# Patient Record
Sex: Female | Born: 1970 | Race: White | Hispanic: No | Marital: Married | Smoking: Never smoker
Health system: Southern US, Community
[De-identification: ages and names within clinical notes are randomized; demographics above are authoritative.]

## PROBLEM LIST (undated history)

## (undated) DIAGNOSIS — I10 Essential (primary) hypertension: Secondary | ICD-10-CM

## (undated) HISTORY — DX: Essential (primary) hypertension: I10

---

## 2002-02-27 ENCOUNTER — Emergency Department (HOSPITAL_COMMUNITY): Admission: EM | Admit: 2002-02-27 | Discharge: 2002-02-27 | Payer: Self-pay | Admitting: Emergency Medicine

## 2005-05-01 ENCOUNTER — Emergency Department (HOSPITAL_COMMUNITY): Admission: EM | Admit: 2005-05-01 | Discharge: 2005-05-02 | Payer: Self-pay | Admitting: *Deleted

## 2013-01-12 ENCOUNTER — Encounter: Payer: Self-pay | Admitting: Nurse Practitioner

## 2013-01-12 ENCOUNTER — Ambulatory Visit: Payer: Self-pay | Admitting: Nurse Practitioner

## 2013-01-12 VITALS — BP 126/81 | HR 80 | Temp 98.0°F | Ht 61.25 in | Wt 165.5 lb

## 2013-01-12 DIAGNOSIS — I10 Essential (primary) hypertension: Secondary | ICD-10-CM

## 2013-01-12 LAB — COMPLETE METABOLIC PANEL WITH GFR
ALT: 24 U/L (ref 0–35)
AST: 47 U/L — ABNORMAL HIGH (ref 0–37)
Albumin: 4 g/dL (ref 3.5–5.2)
Alkaline Phosphatase: 93 U/L (ref 39–117)
BUN: 11 mg/dL (ref 6–23)
Calcium: 9.5 mg/dL (ref 8.4–10.5)
Chloride: 105 mEq/L (ref 96–112)
Potassium: 4.5 mEq/L (ref 3.5–5.3)

## 2013-01-12 MED ORDER — LISINOPRIL 20 MG PO TABS: 20.0000 mg | ORAL_TABLET | Freq: Every day | ORAL | Status: DC

## 2013-01-12 NOTE — Patient Instructions (Signed)

## 2013-01-12 NOTE — Progress Notes (Signed)
  Subjective:    Patient ID: Grace Rodriguez, female    DOB: 04/10/1971, 42 y.o.   MRN: [id number]  Hypertension This is a chronic problem. The current episode started more than 1 year ago. The problem is unchanged. The problem is controlled. Associated symptoms include peripheral edema. Pertinent negatives include no blurred vision, chest pain, headaches, neck pain, palpitations or shortness of breath. There are no associated agents to hypertension. There are no known risk factors for coronary artery disease. Past treatments include ACE inhibitors. The current treatment provides significant improvement. There are no compliance problems.       Review of Systems  HENT: Negative for neck pain.   Eyes: Negative for blurred vision.  Respiratory: Negative for shortness of breath.   Cardiovascular: Negative for chest pain and palpitations.  Neurological: Negative for headaches.  All other systems reviewed and are negative.       Objective:   Physical Exam  Constitutional: She is oriented to person, place, and time. She appears well-developed and well-nourished.  HENT:  Nose: Nose normal.  Mouth/Throat: Oropharynx is clear and moist.  Eyes: EOM are normal.  Neck: Trachea normal, normal range of motion and full passive range of motion without pain. Neck supple. No JVD present. Carotid bruit is not present. No thyromegaly present.  Cardiovascular: Normal rate, regular rhythm, normal heart sounds and intact distal pulses.  Exam reveals no gallop and no friction rub.   No murmur heard. Pulmonary/Chest: Effort normal and breath sounds normal.  Abdominal: Soft. Bowel sounds are normal. She exhibits no distension and no mass. There is no tenderness.  Musculoskeletal: Normal range of motion.  Lymphadenopathy:    She has no cervical adenopathy.  Neurological: She is alert and oriented to person, place, and time. She has normal reflexes.  Skin: Skin is warm and dry.  Psychiatric: She has a normal mood  and affect. Her behavior is normal. Judgment and thought content normal.    BP 126/81  Pulse 80  Temp(Src) 98 F (36.7 C) (Oral)  Ht 5' 1.25" (1.556 m)  Wt 165 lb 8 oz (75.07 kg)  BMI 31.01 kg/m2  LMP 12/18/2012       Assessment & Plan:  1. Hypertension Low NA+ diet LOw fat diet and exercise - COMPLETE METABOLIC PANEL WITH GFR - NMR Lipoprofile with Lipids - lisinopril (PRINIVIL,ZESTRIL) 20 MG tablet; Take 1 tablet (20 mg total) by mouth daily.  Dispense: 30 tablet; Refill: 5  Mary-Margaret Daphine Deutscher, FNP

## 2013-01-14 LAB — NMR LIPOPROFILE WITH LIPIDS
Cholesterol, Total: 168 mg/dL (ref <200)
HDL Particle Number: 40 umol/L (ref >=30.5)
LDL (calc): 96 mg/dL (ref <100)
LDL Particle Number: 1648 nmol/L — ABNORMAL HIGH (ref <1000)
LP-IR Score: 50 — ABNORMAL HIGH (ref <=45)
Triglycerides: 106 mg/dL (ref <150)
VLDL Size: 52.6 nm — ABNORMAL HIGH (ref <=46.6)

## 2013-07-11 ENCOUNTER — Encounter: Payer: Self-pay | Admitting: General Practice

## 2013-07-11 ENCOUNTER — Ambulatory Visit (INDEPENDENT_AMBULATORY_CARE_PROVIDER_SITE_OTHER): Payer: BC Managed Care – PPO | Admitting: General Practice

## 2013-07-11 VITALS — BP 119/80 | HR 88 | Temp 98.9°F | Ht 61.25 in | Wt 159.0 lb

## 2013-07-11 DIAGNOSIS — I10 Essential (primary) hypertension: Secondary | ICD-10-CM

## 2013-07-11 DIAGNOSIS — E785 Hyperlipidemia, unspecified: Secondary | ICD-10-CM

## 2013-07-11 MED ORDER — ROSUVASTATIN CALCIUM 20 MG PO TABS: 20.0000 mg | ORAL_TABLET | Freq: Every day | ORAL | Status: DC

## 2013-07-11 MED ORDER — LISINOPRIL 20 MG PO TABS: 20.0000 mg | ORAL_TABLET | Freq: Every day | ORAL | Status: DC

## 2013-07-11 NOTE — Patient Instructions (Signed)
Hypertriglyceridemia  Diet for High blood levels of Triglycerides Most fats in food are triglycerides. Triglycerides in your blood are stored as fat in your body. High levels of triglycerides in your blood may put you at a greater risk for heart disease and stroke.  Normal triglyceride levels are less than 150 mg/dL. Borderline high levels are 150-199 mg/dl. High levels are 200 - 499 mg/dL, and very high triglyceride levels are greater than 500 mg/dL. The decision to treat high triglycerides is generally based on the level. For people with borderline or high triglyceride levels, treatment includes weight loss and exercise. Drugs are recommended for people with very high triglyceride levels. Many people who need treatment for high triglyceride levels have metabolic syndrome. This syndrome is a collection of disorders that often include: insulin resistance, high blood pressure, blood clotting problems, high cholesterol and triglycerides. TESTING PROCEDURE FOR TRIGLYCERIDES  You should not eat 4 hours before getting your triglycerides measured. The normal range of triglycerides is between 10 and 250 milligrams per deciliter (mg/dl). Some people may have extreme levels (1000 or above), but your triglyceride level may be too high if it is above 150 mg/dl, depending on what other risk factors you have for heart disease.  People with high blood triglycerides may also have high blood cholesterol levels. If you have high blood cholesterol as well as high blood triglycerides, your risk for heart disease is probably greater than if you only had high triglycerides. High blood cholesterol is one of the main risk factors for heart disease. CHANGING YOUR DIET  Your weight can affect your blood triglyceride level. If you are more than 20% above your ideal body weight, you may be able to lower your blood triglycerides by losing weight. Eating less and exercising regularly is the best way to combat this. Fat provides more  calories than any other food. The best way to lose weight is to eat less fat. Only 30% of your total calories should come from fat. Less than 7% of your diet should come from saturated fat. A diet low in fat and saturated fat is the same as a diet to decrease blood cholesterol. By eating a diet lower in fat, you may lose weight, lower your blood cholesterol, and lower your blood triglyceride level.  Eating a diet low in fat, especially saturated fat, may also help you lower your blood triglyceride level. Ask your dietitian to help you figure how much fat you can eat based on the number of calories your caregiver has prescribed for you.  Exercise, in addition to helping with weight loss may also help lower triglyceride levels.   Alcohol can increase blood triglycerides. You may need to stop drinking alcoholic beverages.  Too much carbohydrate in your diet may also increase your blood triglycerides. Some complex carbohydrates are necessary in your diet. These may include bread, rice, potatoes, other starchy vegetables and cereals.  Reduce "simple" carbohydrates. These may include pure sugars, candy, honey, and jelly without losing other nutrients. If you have the kind of high blood triglycerides that is affected by the amount of carbohydrates in your diet, you will need to eat less sugar and less high-sugar foods. Your caregiver can help you with this.  Adding 2-4 grams of fish oil (EPA+ DHA) may also help lower triglycerides. Speak with your caregiver before adding any supplements to your regimen. Following the Diet  Maintain your ideal weight. Your caregivers can help you with a diet. Generally, eating less food and getting more   exercise will help you lose weight. Joining a weight control group may also help. Ask your caregivers for a good weight control group in your area.  Eat low-fat foods instead of high-fat foods. This can help you lose weight too.  These foods are lower in fat. Eat MORE of these:    Dried beans, peas, and lentils.  Egg whites.  Low-fat cottage cheese.  Fish.  Lean cuts of meat, such as round, sirloin, rump, and flank (cut extra fat off meat you fix).  Whole grain breads, cereals and pasta.  Skim and nonfat dry milk.  Low-fat yogurt.  Poultry without the skin.  Cheese made with skim or part-skim milk, such as mozzarella, parmesan, farmers', ricotta, or pot cheese. These are higher fat foods. Eat LESS of these:   Whole milk and foods made from whole milk, such as American, blue, cheddar, monterey jack, and swiss cheese  High-fat meats, such as luncheon meats, sausages, knockwurst, bratwurst, hot dogs, ribs, corned beef, ground pork, and regular ground beef.  Fried foods. Limit saturated fats in your diet. Substituting unsaturated fat for saturated fat may decrease your blood triglyceride level. You will need to read package labels to know which products contain saturated fats.  These foods are high in saturated fat. Eat LESS of these:   Fried pork skins.  Whole milk.  Skin and fat from poultry.  Palm oil.  Butter.  Shortening.  Cream cheese.  Bacon.  Margarines and baked goods made from listed oils.  Vegetable shortenings.  Chitterlings.  Fat from meats.  Coconut oil.  Palm kernel oil.  Lard.  Cream.  Sour cream.  Fatback.  Coffee whiteners and non-dairy creamers made with these oils.  Cheese made from whole milk. Use unsaturated fats (both polyunsaturated and monounsaturated) moderately. Remember, even though unsaturated fats are better than saturated fats; you still want a diet low in total fat.  These foods are high in unsaturated fat:   Canola oil.  Sunflower oil.  Mayonnaise.  Almonds.  Peanuts.  Pine nuts.  Margarines made with these oils.  Safflower oil.  Olive oil.  Avocados.  Cashews.  Peanut butter.  Sunflower seeds.  Soybean oil.  Peanut  oil.  Olives.  Pecans.  Walnuts.  Pumpkin seeds. Avoid sugar and other high-sugar foods. This will decrease carbohydrates without decreasing other nutrients. Sugar in your food goes rapidly to your blood. When there is excess sugar in your blood, your liver may use it to make more triglycerides. Sugar also contains calories without other important nutrients.  Eat LESS of these:   Sugar, brown sugar, powdered sugar, jam, jelly, preserves, honey, syrup, molasses, pies, candy, cakes, cookies, frosting, pastries, colas, soft drinks, punches, fruit drinks, and regular gelatin.  Avoid alcohol. Alcohol, even more than sugar, may increase blood triglycerides. In addition, alcohol is high in calories and low in nutrients. Ask for sparkling water, or a diet soft drink instead of an alcoholic beverage. Suggestions for planning and preparing meals   Bake, broil, grill or roast meats instead of frying.  Remove fat from meats and skin from poultry before cooking.  Add spices, herbs, lemon juice or vinegar to vegetables instead of salt, rich sauces or gravies.  Use a non-stick skillet without fat or use no-stick sprays.  Cool and refrigerate stews and broth. Then remove the hardened fat floating on the surface before serving.  Refrigerate meat drippings and skim off fat to make low-fat gravies.  Serve more fish.  Use less butter,   margarine and other high-fat spreads on bread or vegetables.  Use skim or reconstituted non-fat dry milk for cooking.  Cook with low-fat cheeses.  Substitute low-fat yogurt or cottage cheese for all or part of the sour cream in recipes for sauces, dips or congealed salads.  Use half yogurt/half mayonnaise in salad recipes.  Substitute evaporated skim milk for cream. Evaporated skim milk or reconstituted non-fat dry milk can be whipped and substituted for whipped cream in certain recipes.  Choose fresh fruits for dessert instead of high-fat foods such as pies or  cakes. Fruits are naturally low in fat. When Dining Out   Order low-fat appetizers such as fruit or vegetable juice, pasta with vegetables or tomato sauce.  Select clear, rather than cream soups.  Ask that dressings and gravies be served on the side. Then use less of them.  Order foods that are baked, broiled, poached, steamed, stir-fried, or roasted.  Ask for margarine instead of butter, and use only a small amount.  Drink sparkling water, unsweetened tea or coffee, or diet soft drinks instead of alcohol or other sweet beverages. QUESTIONS AND ANSWERS ABOUT OTHER FATS IN THE BLOOD: SATURATED FAT, TRANS FAT, AND CHOLESTEROL What is trans fat? Trans fat is a type of fat that is formed when vegetable oil is hardened through a process called hydrogenation. This process helps makes foods more solid, gives them shape, and prolongs their shelf life. Trans fats are also called hydrogenated or partially hydrogenated oils.  What do saturated fat, trans fat, and cholesterol in foods have to do with heart disease? Saturated fat, trans fat, and cholesterol in the diet all raise the level of LDL "bad" cholesterol in the blood. The higher the LDL cholesterol, the greater the risk for coronary heart disease (CHD). Saturated fat and trans fat raise LDL similarly.  What foods contain saturated fat, trans fat, and cholesterol? High amounts of saturated fat are found in animal products, such as fatty cuts of meat, chicken skin, and full-fat dairy products like butter, whole milk, cream, and cheese, and in tropical vegetable oils such as palm, palm kernel, and coconut oil. Trans fat is found in some of the same foods as saturated fat, such as vegetable shortening, some margarines (especially hard or stick margarine), crackers, cookies, baked goods, fried foods, salad dressings, and other processed foods made with partially hydrogenated vegetable oils. Small amounts of trans fat also occur naturally in some animal  products, such as milk products, beef, and lamb. Foods high in cholesterol include liver, other organ meats, egg yolks, shrimp, and full-fat dairy products. How can I use the new food label to make heart-healthy food choices? Check the Nutrition Facts panel of the food label. Choose foods lower in saturated fat, trans fat, and cholesterol. For saturated fat and cholesterol, you can also use the Percent Daily Value (%DV): 5% DV or less is low, and 20% DV or more is high. (There is no %DV for trans fat.) Use the Nutrition Facts panel to choose foods low in saturated fat and cholesterol, and if the trans fat is not listed, read the ingredients and limit products that list shortening or hydrogenated or partially hydrogenated vegetable oil, which tend to be high in trans fat. POINTS TO REMEMBER:   Discuss your risk for heart disease with your caregivers, and take steps to reduce risk factors.  Change your diet. Choose foods that are low in saturated fat, trans fat, and cholesterol.  Add exercise to your daily routine if   it is not already being done. Participate in physical activity of moderate intensity, like brisk walking, for at least 30 minutes on most, and preferably all days of the week. No time? Break the 30 minutes into three, 10-minute segments during the day.  Stop smoking. If you do smoke, contact your caregiver to discuss ways in which they can help you quit.  Do not use street drugs.  Maintain a normal weight.  Maintain a healthy blood pressure.  Keep up with your blood work for checking the fats in your blood as directed by your caregiver. Document Released: 06/05/2004 Document Revised: 02/17/2012 Document Reviewed: 01/01/2009 [employer] Patient Information 2014 [employer].  

## 2013-07-11 NOTE — Progress Notes (Signed)
  Subjective:    Patient ID: Grace Rodriguez, female    DOB: May 01, 1971, 42 y.o.   MRN: [id number]  HPI Patient presents today for chronic health follow up. She has a history of hypertension and hyperlipidemia. She denies taking any medication for hyperlipidemia. She is willing to begin prescription medication for hyperlipidemia.  Denies regular exercise or healthy eating.    Review of Systems  Constitutional: Negative for fever and chills.  Respiratory: Negative for chest tightness and shortness of breath.   Cardiovascular: Negative for chest pain and palpitations.  Gastrointestinal: Negative for nausea, vomiting, abdominal pain, diarrhea, constipation and blood in stool.  Genitourinary: Negative for dysuria and difficulty urinating.  Neurological: Negative for dizziness, weakness and headaches.       Objective:   Physical Exam  Constitutional: She is oriented to person, place, and time. She appears well-developed and well-nourished.  HENT:  Head: Normocephalic and atraumatic.  Right Ear: External ear normal.  Left Ear: External ear normal.  Nose: Nose normal.  Mouth/Throat: Oropharynx is clear and moist.  Eyes: EOM are normal. Pupils are equal, round, and reactive to light.  Neck: Normal range of motion. Neck supple. No thyromegaly present.  Cardiovascular: Normal rate, regular rhythm and normal heart sounds.   Pulmonary/Chest: Effort normal and breath sounds normal. No respiratory distress. She exhibits no tenderness.  Abdominal: Soft. Bowel sounds are normal. She exhibits no distension. There is no tenderness.  Musculoskeletal: She exhibits no edema and no tenderness.  Lymphadenopathy:    She has no cervical adenopathy.  Neurological: She is alert and oriented to person, place, and time.  Skin: Skin is warm and dry.  Psychiatric: She has a normal mood and affect.          Assessment & Plan:  1. Hypertension - lisinopril (PRINIVIL,ZESTRIL) 20 MG tablet; Take 1 tablet (20 mg  total) by mouth daily.  Dispense: 30 tablet; Refill: 5 - CMP14+EGFR  2. Hyperlipidemia - NMR, lipoprofile - rosuvastatin (CRESTOR) 20 MG tablet; Take 1 tablet (20 mg total) by mouth daily.  Dispense: 30 tablet; Refill: 3 (patient may pick up prescription after completing samples, if able to tolerate, otherwise contact office) -crestor 10mg  tablets, lot # [id number], exp date 10-2015, samples given 35 tablets  -discussed regular exercise and healthy eating -RTO in 6 months for routine follow up -Patient verbalized understanding -Coralie Keens, FNP-C

## 2013-07-12 ENCOUNTER — Other Ambulatory Visit (INDEPENDENT_AMBULATORY_CARE_PROVIDER_SITE_OTHER): Payer: BC Managed Care – PPO

## 2013-07-12 DIAGNOSIS — R7989 Other specified abnormal findings of blood chemistry: Secondary | ICD-10-CM

## 2013-07-12 NOTE — Progress Notes (Signed)
K+ come in 07/12/13 as 5.9 still waiting on NMR for labs to result in epic  Will call patient to come in for a repeat k+ due to critical result from 07/11/13

## 2013-07-13 ENCOUNTER — Other Ambulatory Visit: Payer: Self-pay | Admitting: General Practice

## 2013-07-13 ENCOUNTER — Telehealth: Payer: Self-pay | Admitting: General Practice

## 2013-07-13 LAB — NMR, LIPOPROFILE
Cholesterol: 158 mg/dL (ref <200)
HDL Cholesterol by NMR: 50 mg/dL (ref >=40)
HDL Particle Number: 43.1 umol/L (ref >=30.5)
LDLC SERPL CALC-MCNC: 85 mg/dL (ref <100)
Triglycerides by NMR: 117 mg/dL (ref <150)

## 2013-07-13 LAB — CMP14+EGFR
Albumin/Globulin Ratio: 1.5 (ref 1.1–2.5)
Albumin: 4.4 g/dL (ref 3.5–5.5)
BUN: 10 mg/dL (ref 6–24)
CO2: 24 mmol/L (ref 18–29)
Calcium: 9.8 mg/dL (ref 8.7–10.2)
Creatinine, Ser: 0.6 mg/dL (ref 0.57–1.00)
GFR calc non Af Amer: 111 mL/min/{1.73_m2} (ref >59)
Globulin, Total: 3 g/dL (ref 1.5–4.5)
Total Protein: 7.4 g/dL (ref 6.0–8.5)

## 2013-07-13 LAB — POTASSIUM: Potassium: 4.7 mmol/L (ref 3.5–5.2)

## 2013-11-03 ENCOUNTER — Encounter: Payer: Self-pay | Admitting: Nurse Practitioner

## 2013-11-03 ENCOUNTER — Ambulatory Visit (INDEPENDENT_AMBULATORY_CARE_PROVIDER_SITE_OTHER): Payer: BC Managed Care – PPO | Admitting: Nurse Practitioner

## 2013-11-03 VITALS — BP 120/79 | HR 90 | Temp 98.6°F | Ht 61.0 in | Wt 155.0 lb

## 2013-11-03 DIAGNOSIS — J069 Acute upper respiratory infection, unspecified: Secondary | ICD-10-CM

## 2013-11-03 DIAGNOSIS — J029 Acute pharyngitis, unspecified: Secondary | ICD-10-CM

## 2013-11-03 LAB — POCT RAPID STREP A (OFFICE): RAPID STREP A SCREEN: NEGATIVE

## 2013-11-03 MED ORDER — AZITHROMYCIN 250 MG PO TABS: ORAL_TABLET | ORAL | Status: DC

## 2013-11-03 NOTE — Patient Instructions (Signed)

## 2013-11-03 NOTE — Progress Notes (Signed)
   Subjective:    Patient ID: Grace Rodriguez, female    DOB: 23-Sep-1970, 43 y.o.   MRN: [id number]  HPI  Patient in c/o cough and congestion that started several days ago- sore throat started yesterday. Low grade fever in the evening.    Review of Systems  Constitutional: Positive for fever and chills.  HENT: Positive for congestion, ear pain, rhinorrhea, sore throat, trouble swallowing and voice change.   Respiratory: Choking: nonproductive.   Cardiovascular: Negative.   All other systems reviewed and are negative.       Objective:   Physical Exam  Constitutional: She is oriented to person, place, and time. She appears well-developed and well-nourished.  HENT:  Right Ear: Hearing, tympanic membrane, external ear and ear canal normal.  Left Ear: Hearing, tympanic membrane, external ear and ear canal normal.  Nose: Mucosal edema and rhinorrhea present. Right sinus exhibits no maxillary sinus tenderness and no frontal sinus tenderness. Left sinus exhibits no maxillary sinus tenderness and no frontal sinus tenderness.  Mouth/Throat: Uvula is midline. Posterior oropharyngeal edema and posterior oropharyngeal erythema (mild) present.  Eyes: Pupils are equal, round, and reactive to light.  Neck: Normal range of motion. Neck supple. No thyromegaly present.  Cardiovascular: Normal rate, regular rhythm and normal heart sounds.   Pulmonary/Chest: Effort normal and breath sounds normal.  Abdominal: Soft. Bowel sounds are normal.  Lymphadenopathy:    She has no cervical adenopathy.  Neurological: She is alert and oriented to person, place, and time.  Skin: Skin is warm and dry.  Psychiatric: She has a normal mood and affect. Her behavior is normal. Judgment and thought content normal.   BP 120/79  Pulse 90  Temp(Src) 98.6 F (37 C) (Oral)  Ht 5\' 1"  (1.549 m)  Wt 155 lb (70.308 kg)  BMI 29.30 kg/m2        Assessment & Plan:   1. Acute pharyngitis   2. Upper respiratory infection  with cough and congestion    No orders of the defined types were placed in this encounter.   1. Take meds as prescribed 2. Use a cool mist humidifier especially during the winter months and when heat has been humid. 3. Use saline nose sprays frequently 4. Saline irrigations of the nose can be very helpful if done frequently.  * 4X daily for 1 week*  * Use of a nettie pot can be helpful with this. Follow directions with this* 5. Drink plenty of fluids 6. Keep thermostat turn down low 7.For any cough or congestion  Use plain Mucinex- regular strength or max strength is fine   * Children- consult with Pharmacist for dosing 8. For fever or aces or pains- take tylenol or ibuprofen appropriate for age and weight.  * for fevers greater than 101 orally you may alternate ibuprofen and tylenol every  3 hours.   Mary-Margaret Daphine DeutscherMartin, FNP

## 2013-12-12 ENCOUNTER — Other Ambulatory Visit: Payer: Self-pay | Admitting: General Practice

## 2014-01-09 ENCOUNTER — Encounter: Payer: Self-pay | Admitting: Nurse Practitioner

## 2014-01-09 ENCOUNTER — Ambulatory Visit (INDEPENDENT_AMBULATORY_CARE_PROVIDER_SITE_OTHER): Payer: BC Managed Care – PPO | Admitting: Nurse Practitioner

## 2014-01-09 VITALS — BP 122/78 | HR 86 | Temp 98.2°F | Ht 60.0 in | Wt 158.0 lb

## 2014-01-09 DIAGNOSIS — Z01419 Encounter for gynecological examination (general) (routine) without abnormal findings: Secondary | ICD-10-CM

## 2014-01-09 DIAGNOSIS — Z0000 Encounter for general adult medical examination without abnormal findings: Secondary | ICD-10-CM

## 2014-01-09 DIAGNOSIS — E785 Hyperlipidemia, unspecified: Secondary | ICD-10-CM | POA: Insufficient documentation

## 2014-01-09 DIAGNOSIS — I10 Essential (primary) hypertension: Secondary | ICD-10-CM

## 2014-01-09 DIAGNOSIS — Z124 Encounter for screening for malignant neoplasm of cervix: Secondary | ICD-10-CM

## 2014-01-09 LAB — POCT URINALYSIS DIPSTICK
BILIRUBIN UA: NEGATIVE
Glucose, UA: NEGATIVE
KETONES UA: NEGATIVE
LEUKOCYTES UA: NEGATIVE
Nitrite, UA: NEGATIVE
PH UA: 5
Protein, UA: NEGATIVE
SPEC GRAV UA: 1.01
Urobilinogen, UA: NEGATIVE

## 2014-01-09 LAB — POCT CBC
GRANULOCYTE PERCENT: 60.6 % (ref 37–80)
HEMATOCRIT: 38.8 % (ref 37.7–47.9)
HEMOGLOBIN: 12.5 g/dL (ref 12.2–16.2)
Lymph, poc: 3.4 (ref 0.6–3.4)
MCH, POC: 28.9 pg (ref 27–31.2)
MCHC: 32.2 g/dL (ref 31.8–35.4)
MCV: 89.8 fL (ref 80–97)
MPV: 7.8 fL (ref 0–99.8)
POC Granulocyte: 6 (ref 2–6.9)
POC LYMPH %: 34.4 % (ref 10–50)
Platelet Count, POC: 414 10*3/uL (ref 142–424)
RBC: 4.3 M/uL (ref 4.04–5.48)
RDW, POC: 14.4 %
WBC: 9.9 10*3/uL (ref 4.6–10.2)

## 2014-01-09 MED ORDER — ROSUVASTATIN CALCIUM 20 MG PO TABS: 20.0000 mg | ORAL_TABLET | Freq: Every day | ORAL | Status: DC

## 2014-01-09 MED ORDER — LISINOPRIL 20 MG PO TABS: 20.0000 mg | ORAL_TABLET | Freq: Every day | ORAL | Status: DC

## 2014-01-09 NOTE — Progress Notes (Signed)
Subjective:    Patient ID: Grace Rodriguez, female    DOB: 12/16/1970, 43 y.o.   MRN: [id number]  Patient here today for CPE and PAP- She is doing well today without complaints  Hypertension This is a chronic problem. The current episode started more than 1 year ago. The problem is unchanged. The problem is controlled. Associated symptoms include peripheral edema. Pertinent negatives include no blurred vision, chest pain, headaches, neck pain, palpitations or shortness of breath. There are no associated agents to hypertension. There are no known risk factors for coronary artery disease. Past treatments include ACE inhibitors. The current treatment provides significant improvement. There are no compliance problems.   Hyperlipidemia This is a chronic problem. The current episode started more than 1 year ago. The problem is uncontrolled. Recent lipid tests were reviewed and are high. Pertinent negatives include no chest pain or shortness of breath. Compliance problems include adherence to diet and adherence to exercise.  Risk factors for coronary artery disease include hypertension.      Review of Systems  Eyes: Negative for blurred vision.  Respiratory: Negative for shortness of breath.   Cardiovascular: Negative for chest pain and palpitations.  Musculoskeletal: Negative for neck pain.  Neurological: Negative for headaches.  All other systems reviewed and are negative.      Objective:   Physical Exam  Constitutional: She is oriented to person, place, and time. She appears well-developed and well-nourished.  HENT:  Head: Normocephalic.  Right Ear: Hearing, tympanic membrane, external ear and ear canal normal.  Left Ear: Hearing, tympanic membrane, external ear and ear canal normal.  Nose: Nose normal.  Mouth/Throat: Uvula is midline and oropharynx is clear and moist.  Eyes: Conjunctivae and EOM are normal. Pupils are equal, round, and reactive to light.  Neck: Trachea normal, normal range  of motion and full passive range of motion without pain. Neck supple. No JVD present. Carotid bruit is not present. No mass and no thyromegaly present.  Cardiovascular: Normal rate, regular rhythm, normal heart sounds and intact distal pulses.  Exam reveals no gallop and no friction rub.   No murmur heard. Pulmonary/Chest: Effort normal and breath sounds normal. Right breast exhibits no inverted nipple, no mass, no nipple discharge, no skin change and no tenderness. Left breast exhibits no inverted nipple, no mass, no nipple discharge, no skin change and no tenderness.  Abdominal: Soft. Bowel sounds are normal. She exhibits no distension and no mass. There is no tenderness.  Genitourinary: Vagina normal and uterus normal. No breast swelling, tenderness, discharge or bleeding.  bimanual exam-No adnexal masses or tenderness. Cervix parous and pink- no discharge Cystocele Uterus anteverted  Musculoskeletal: Normal range of motion.  Lymphadenopathy:    She has no cervical adenopathy.  Neurological: She is alert and oriented to person, place, and time. She has normal reflexes.  Skin: Skin is warm and dry.  Psychiatric: She has a normal mood and affect. Her behavior is normal. Judgment and thought content normal.    BP 122/78  Pulse 86  Temp(Src) 98.2 F (36.8 C) (Oral)  Ht 5' (1.524 m)  Wt 158 lb (71.668 kg)  BMI 30.86 kg/m2  Results for orders placed in visit on 01/09/14  POCT URINALYSIS DIPSTICK      Result Value Ref Range   Color, UA gold     Clarity, UA clear     Glucose, UA neg     Bilirubin, UA neg     Ketones, UA neg  Spec Grav, UA 1.010     Blood, UA trace     pH, UA 5.0     Protein, UA neg     Urobilinogen, UA negative     Nitrite, UA neg     Leukocytes, UA Negative           Assessment & Plan:   1. Annual physical exam   2. Encounter for routine gynecological examination   3. Hypertension   4. Hyperlipidemia LDL goal < 100    Orders Placed This Encounter   Procedures  . CMP14+EGFR  . NMR, lipoprofile  . Thyroid Panel With TSH  . POCT urinalysis dipstick  . POCT CBC   Meds ordered this encounter  Medications  . rosuvastatin (CRESTOR) 20 MG tablet    Sig: Take 1 tablet (20 mg total) by mouth daily.    Dispense:  30 tablet    Refill:  3    Order Specific Question:  Supervising Provider    Answer:  Chipper Herb [[id number]]  . lisinopril (PRINIVIL,ZESTRIL) 20 MG tablet    Sig: Take 1 tablet (20 mg total) by mouth daily.    Dispense:  30 tablet    Refill:  5    Order Specific Question:  Supervising Provider    Answer:  Chipper Herb [[id number]]   patient to schedule mammogram on the way out today Labs pending Health maintenance reviewed Diet and exercise encouraged Continue all meds Follow up  In 6 months   [place], FNP

## 2014-01-09 NOTE — Patient Instructions (Signed)

## 2014-01-10 ENCOUNTER — Telehealth: Payer: Self-pay | Admitting: Family Medicine

## 2014-01-10 LAB — NMR, LIPOPROFILE
CHOLESTEROL: 126 mg/dL (ref 100–199)
HDL Cholesterol by NMR: 51 mg/dL (ref >39)
HDL Particle Number: 41.2 umol/L (ref >=30.5)
LDL PARTICLE NUMBER: 928 nmol/L (ref <1000)
LDL Size: 20.3 nm (ref >20.5)
LDLC SERPL CALC-MCNC: 62 mg/dL (ref 0–99)
LP-IR Score: 45 (ref <=45)
Small LDL Particle Number: 675 nmol/L — ABNORMAL HIGH (ref <=527)
TRIGLYCERIDES BY NMR: 66 mg/dL (ref 0–149)

## 2014-01-10 LAB — THYROID PANEL WITH TSH
Free Thyroxine Index: 1.7 (ref 1.2–4.9)
T3 UPTAKE RATIO: 20 % — AB (ref 24–39)
T4, Total: 8.7 ug/dL (ref 4.5–12.0)
TSH: 3.01 u[IU]/mL (ref 0.450–4.500)

## 2014-01-10 LAB — PAP IG W/ RFLX HPV ASCU: PAP Smear Comment: 0

## 2014-01-10 LAB — CMP14+EGFR
ALBUMIN: 4.6 g/dL (ref 3.5–5.5)
ALT: 30 IU/L (ref 0–32)
AST: 57 IU/L — ABNORMAL HIGH (ref 0–40)
Albumin/Globulin Ratio: 1.7 (ref 1.1–2.5)
Alkaline Phosphatase: 112 IU/L (ref 39–117)
BUN/Creatinine Ratio: 17 (ref 9–23)
BUN: 12 mg/dL (ref 6–24)
CO2: 24 mmol/L (ref 18–29)
Calcium: 10 mg/dL (ref 8.7–10.2)
Chloride: 99 mmol/L (ref 97–108)
Creatinine, Ser: 0.7 mg/dL (ref 0.57–1.00)
GFR calc Af Amer: 121 mL/min/{1.73_m2} (ref >59)
GFR calc non Af Amer: 105 mL/min/{1.73_m2} (ref >59)
GLUCOSE: 96 mg/dL (ref 65–99)
Globulin, Total: 2.7 g/dL (ref 1.5–4.5)
Potassium: 4.9 mmol/L (ref 3.5–5.2)
Sodium: 139 mmol/L (ref 134–144)
TOTAL PROTEIN: 7.3 g/dL (ref 6.0–8.5)
Total Bilirubin: 0.3 mg/dL (ref 0.0–1.2)

## 2014-01-10 NOTE — Telephone Encounter (Signed)
Message copied by Azalee CourseFULP, ASHLEY on Tue Jan 10, 2014 10:44 AM ------      Message from: Bennie PieriniMARTIN, MARY-MARGARET      Created: Tue Jan 10, 2014 10:17 AM       Cbc- normal      Kidney and liver function stable      Cholesterol good      Thyroid normal      Urine- clear      Continue current meds- low fat diet and exercise and recheck in 6 months       ------

## 2014-01-10 NOTE — Telephone Encounter (Signed)
Pt aware of lab results 

## 2014-01-13 ENCOUNTER — Telehealth: Payer: Self-pay | Admitting: *Deleted

## 2014-01-13 NOTE — Telephone Encounter (Signed)
Aware. 

## 2014-06-12 ENCOUNTER — Other Ambulatory Visit: Payer: Self-pay | Admitting: Nurse Practitioner

## 2014-07-12 ENCOUNTER — Ambulatory Visit (INDEPENDENT_AMBULATORY_CARE_PROVIDER_SITE_OTHER): Payer: BC Managed Care – PPO | Admitting: Nurse Practitioner

## 2014-07-12 ENCOUNTER — Encounter: Payer: Self-pay | Admitting: Nurse Practitioner

## 2014-07-12 VITALS — BP 131/88 | HR 81 | Temp 97.5°F | Ht 60.0 in | Wt 164.2 lb

## 2014-07-12 DIAGNOSIS — E785 Hyperlipidemia, unspecified: Secondary | ICD-10-CM

## 2014-07-12 DIAGNOSIS — I10 Essential (primary) hypertension: Secondary | ICD-10-CM

## 2014-07-12 MED ORDER — LISINOPRIL 20 MG PO TABS
ORAL_TABLET | ORAL | Status: DC
Start: 2014-07-12 — End: 2015-01-11

## 2014-07-12 MED ORDER — ROSUVASTATIN CALCIUM 20 MG PO TABS: 20.0000 mg | ORAL_TABLET | Freq: Every day | ORAL | Status: DC

## 2014-07-12 NOTE — Progress Notes (Signed)
  Subjective:    Patient ID: Grace Rodriguez, female    DOB: Dec 28, 1970, 43 y.o.   MRN: [id number]  Patient here today for CPE and PAP- She is doing well today without complaints  Hypertension This is a chronic problem. The current episode started more than 1 year ago. The problem is unchanged. The problem is controlled. Pertinent negatives include no chest pain, headaches, neck pain, palpitations or shortness of breath. Risk factors for coronary artery disease include obesity and dyslipidemia. Past treatments include ACE inhibitors. The current treatment provides significant improvement. Compliance problems include exercise and diet.   Hyperlipidemia This is a chronic problem. The current episode started more than 1 year ago. The problem is controlled. Recent lipid tests were reviewed and are normal. She has no history of diabetes or hypothyroidism. Pertinent negatives include no chest pain or shortness of breath. Current antihyperlipidemic treatment includes statins. The current treatment provides significant improvement of lipids. Compliance problems include adherence to diet and adherence to exercise.  Risk factors for coronary artery disease include dyslipidemia and hypertension.      Review of Systems  Respiratory: Negative for shortness of breath.   Cardiovascular: Negative for chest pain and palpitations.  Musculoskeletal: Negative for neck pain.  Neurological: Negative for headaches.  All other systems reviewed and are negative.      Objective:   Physical Exam  Constitutional: She is oriented to person, place, and time. She appears well-developed and well-nourished.  HENT:  Nose: Nose normal.  Mouth/Throat: Oropharynx is clear and moist.  Eyes: EOM are normal.  Neck: Trachea normal, normal range of motion and full passive range of motion without pain. Neck supple. No JVD present. Carotid bruit is not present. No thyromegaly present.  Cardiovascular: Normal rate, regular rhythm,  normal heart sounds and intact distal pulses.  Exam reveals no gallop and no friction rub.   No murmur heard. Pulmonary/Chest: Effort normal and breath sounds normal.  Abdominal: Soft. Bowel sounds are normal. She exhibits no distension and no mass. There is no tenderness.  Musculoskeletal: Normal range of motion.  Lymphadenopathy:    She has no cervical adenopathy.  Neurological: She is alert and oriented to person, place, and time. She has normal reflexes.  Skin: Skin is warm and dry.  Psychiatric: She has a normal mood and affect. Her behavior is normal. Judgment and thought content normal.    BP 131/88 mmHg  Pulse 81  Temp(Src) 97.5 F (36.4 C) (Oral)  Ht 5' (1.524 m)  Wt 164 lb 3.2 oz (74.481 kg)  BMI 32.07 kg/m2         Assessment & Plan:    1. Essential hypertension Low NA diet - CMP14+EGFR - lisinopril (PRINIVIL,ZESTRIL) 20 MG tablet; TAKE ONE (1) TABLET EACH DAY  Dispense: 30 tablet; Refill: 5  2. Hyperlipidemia with target LDL less than 100 Low fat diet - NMR, lipoprofile - rosuvastatin (CRESTOR) 20 MG tablet; Take 1 tablet (20 mg total) by mouth daily.  Dispense: 30 tablet; Refill: 5    Labs pending Health maintenance reviewed Diet and exercise encouraged Continue all meds Follow up  In 3 month   [place], FNP

## 2014-07-12 NOTE — Patient Instructions (Signed)

## 2014-07-13 LAB — CMP14+EGFR
ALT: 40 IU/L — ABNORMAL HIGH (ref 0–32)
AST: 53 IU/L — ABNORMAL HIGH (ref 0–40)
Albumin/Globulin Ratio: 1.6 (ref 1.1–2.5)
Albumin: 4.3 g/dL (ref 3.5–5.5)
Alkaline Phosphatase: 106 IU/L (ref 39–117)
BUN/Creatinine Ratio: 16 (ref 9–23)
BUN: 9 mg/dL (ref 6–24)
CO2: 24 mmol/L (ref 18–29)
Calcium: 9.5 mg/dL (ref 8.7–10.2)
Chloride: 100 mmol/L (ref 97–108)
Creatinine, Ser: 0.55 mg/dL — ABNORMAL LOW (ref 0.57–1.00)
GFR calc Af Amer: 131 mL/min/1.73
GFR calc non Af Amer: 114 mL/min/1.73
Globulin, Total: 2.7 g/dL (ref 1.5–4.5)
Glucose: 81 mg/dL (ref 65–99)
Potassium: 4.9 mmol/L (ref 3.5–5.2)
Sodium: 142 mmol/L (ref 134–144)
Total Bilirubin: 0.3 mg/dL (ref 0.0–1.2)
Total Protein: 7 g/dL (ref 6.0–8.5)

## 2014-07-13 LAB — NMR, LIPOPROFILE
Cholesterol: 123 mg/dL (ref 100–199)
HDL Cholesterol by NMR: 50 mg/dL
HDL Particle Number: 38 umol/L
LDL Particle Number: 639 nmol/L
LDL Size: 21.1 nm
LDL-C: 54 mg/dL (ref 0–99)
LP-IR Score: 37
Small LDL Particle Number: 364 nmol/L
Triglycerides by NMR: 93 mg/dL (ref 0–149)

## 2015-01-11 ENCOUNTER — Ambulatory Visit (INDEPENDENT_AMBULATORY_CARE_PROVIDER_SITE_OTHER): Payer: BLUE CROSS/BLUE SHIELD | Admitting: Nurse Practitioner

## 2015-01-11 ENCOUNTER — Encounter: Payer: Self-pay | Admitting: Nurse Practitioner

## 2015-01-11 VITALS — BP 122/72 | HR 73 | Temp 98.3°F | Ht 60.0 in | Wt 164.8 lb

## 2015-01-11 DIAGNOSIS — I10 Essential (primary) hypertension: Secondary | ICD-10-CM | POA: Diagnosis not present

## 2015-01-11 DIAGNOSIS — Z6832 Body mass index (BMI) 32.0-32.9, adult: Secondary | ICD-10-CM

## 2015-01-11 DIAGNOSIS — E785 Hyperlipidemia, unspecified: Secondary | ICD-10-CM | POA: Diagnosis not present

## 2015-01-11 DIAGNOSIS — Z6830 Body mass index (BMI) 30.0-30.9, adult: Secondary | ICD-10-CM | POA: Insufficient documentation

## 2015-01-11 MED ORDER — ROSUVASTATIN CALCIUM 20 MG PO TABS: 20.0000 mg | ORAL_TABLET | Freq: Every day | ORAL | Status: DC

## 2015-01-11 MED ORDER — LISINOPRIL 20 MG PO TABS: ORAL_TABLET | ORAL | Status: DC

## 2015-01-11 NOTE — Patient Instructions (Signed)
Fat and Cholesterol Control Diet Fat and cholesterol levels in your blood and organs are influenced by your diet. High levels of fat and cholesterol may lead to diseases of the heart, small and large blood vessels, gallbladder, liver, and pancreas. CONTROLLING FAT AND CHOLESTEROL WITH DIET Although exercise and lifestyle factors are important, your diet is key. That is because certain foods are known to raise cholesterol and others to lower it. The goal is to balance foods for their effect on cholesterol and more importantly, to replace saturated and trans fat with other types of fat, such as monounsaturated fat, polyunsaturated fat, and omega-3 fatty acids. On average, a person should consume no more than 15 to 17 g of saturated fat daily. Saturated and trans fats are considered "bad" fats, and they will raise LDL cholesterol. Saturated fats are primarily found in animal products such as meats, butter, and cream. However, that does not mean you need to give up all your favorite foods. Today, there are good tasting, low-fat, low-cholesterol substitutes for most of the things you like to eat. Choose low-fat or nonfat alternatives. Choose round or loin cuts of red meat. These types of cuts are lowest in fat and cholesterol. Chicken (without the skin), fish, veal, and ground turkey breast are great choices. Eliminate fatty meats, such as hot dogs and salami. Even shellfish have little or no saturated fat. Have a 3 oz (85 g) portion when you eat lean meat, poultry, or fish. Trans fats are also called "partially hydrogenated oils." They are oils that have been scientifically manipulated so that they are solid at room temperature resulting in a longer shelf life and improved taste and texture of foods in which they are added. Trans fats are found in stick margarine, some tub margarines, cookies, crackers, and baked goods.  When baking and cooking, oils are a great substitute for butter. The monounsaturated oils are  especially beneficial since it is believed they lower LDL and raise HDL. The oils you should avoid entirely are saturated tropical oils, such as coconut and palm.  Remember to eat a lot from food groups that are naturally free of saturated and trans fat, including fish, fruit, vegetables, beans, grains (barley, rice, couscous, bulgur wheat), and pasta (without cream sauces).  IDENTIFYING FOODS THAT LOWER FAT AND CHOLESTEROL  Soluble fiber may lower your cholesterol. This type of fiber is found in fruits such as apples, vegetables such as broccoli, potatoes, and carrots, legumes such as beans, peas, and lentils, and grains such as barley. Foods fortified with plant sterols (phytosterol) may also lower cholesterol. You should eat at least 2 g per day of these foods for a cholesterol lowering effect.  Read package labels to identify low-saturated fats, trans fat free, and low-fat foods at the supermarket. Select cheeses that have only 2 to 3 g saturated fat per ounce. Use a heart-healthy tub margarine that is free of trans fats or partially hydrogenated oil. When buying baked goods (cookies, crackers), avoid partially hydrogenated oils. Breads and muffins should be made from whole grains (whole-wheat or whole oat flour, instead of "flour" or "enriched flour"). Buy non-creamy canned soups with reduced salt and no added fats.  FOOD PREPARATION TECHNIQUES  Never deep-fry. If you must fry, either stir-fry, which uses very little fat, or use non-stick cooking sprays. When possible, broil, bake, or roast meats, and steam vegetables. Instead of putting butter or margarine on vegetables, use lemon and herbs, applesauce, and cinnamon (for squash and sweet potatoes). Use nonfat   yogurt, salsa, and low-fat dressings for salads.  LOW-SATURATED FAT / LOW-FAT FOOD SUBSTITUTES Meats / Saturated Fat (g)  Avoid: Steak, marbled (3 oz/85 g) / 11 g  Choose: Steak, lean (3 oz/85 g) / 4 g  Avoid: Hamburger (3 oz/85 g) / 7  g  Choose: Hamburger, lean (3 oz/85 g) / 5 g  Avoid: Ham (3 oz/85 g) / 6 g  Choose: Ham, lean cut (3 oz/85 g) / 2.4 g  Avoid: Chicken, with skin, dark meat (3 oz/85 g) / 4 g  Choose: Chicken, skin removed, dark meat (3 oz/85 g) / 2 g  Avoid: Chicken, with skin, light meat (3 oz/85 g) / 2.5 g  Choose: Chicken, skin removed, light meat (3 oz/85 g) / 1 g Dairy / Saturated Fat (g)  Avoid: Whole milk (1 cup) / 5 g  Choose: Low-fat milk, 2% (1 cup) / 3 g  Choose: Low-fat milk, 1% (1 cup) / 1.5 g  Choose: Skim milk (1 cup) / 0.3 g  Avoid: Hard cheese (1 oz/28 g) / 6 g  Choose: Skim milk cheese (1 oz/28 g) / 2 to 3 g  Avoid: Cottage cheese, 4% fat (1 cup) / 6.5 g  Choose: Low-fat cottage cheese, 1% fat (1 cup) / 1.5 g  Avoid: Ice cream (1 cup) / 9 g  Choose: Sherbet (1 cup) / 2.5 g  Choose: Nonfat frozen yogurt (1 cup) / 0.3 g  Choose: Frozen fruit bar / trace  Avoid: Whipped cream (1 tbs) / 3.5 g  Choose: Nondairy whipped topping (1 tbs) / 1 g Condiments / Saturated Fat (g)  Avoid: Mayonnaise (1 tbs) / 2 g  Choose: Low-fat mayonnaise (1 tbs) / 1 g  Avoid: Butter (1 tbs) / 7 g  Choose: Extra light margarine (1 tbs) / 1 g  Avoid: Coconut oil (1 tbs) / 11.8 g  Choose: Olive oil (1 tbs) / 1.8 g  Choose: Corn oil (1 tbs) / 1.7 g  Choose: Safflower oil (1 tbs) / 1.2 g  Choose: Sunflower oil (1 tbs) / 1.4 g  Choose: Soybean oil (1 tbs) / 2.4 g  Choose: Canola oil (1 tbs) / 1 g Document Released: 08/18/2005 Document Revised: 12/13/2012 Document Reviewed: 11/16/2013 [employer] Patient Information 2015 [employer]. This information is not intended to replace advice given to you by your health care provider. Make sure you discuss any questions you have with your health care provider.  

## 2015-01-11 NOTE — Progress Notes (Signed)
  Subjective:    Patient ID: Grace Rodriguez, female    DOB: November 25, 1970, 44 y.o.   MRN: [id number]  Patient here today for follow up of chronic medical problems.   Hypertension This is a chronic problem. The current episode started more than 1 year ago. The problem is unchanged. The problem is controlled. Pertinent negatives include no chest pain, headaches, neck pain, palpitations or shortness of breath. Risk factors for coronary artery disease include obesity and dyslipidemia. Past treatments include ACE inhibitors. The current treatment provides significant improvement. Compliance problems include exercise and diet.   Hyperlipidemia This is a chronic problem. The current episode started more than 1 year ago. The problem is controlled. Recent lipid tests were reviewed and are normal. She has no history of diabetes or hypothyroidism. Pertinent negatives include no chest pain or shortness of breath. Current antihyperlipidemic treatment includes statins. The current treatment provides significant improvement of lipids. Compliance problems include adherence to diet and adherence to exercise.  Risk factors for coronary artery disease include dyslipidemia and hypertension.      Review of Systems  Respiratory: Negative for shortness of breath.   Cardiovascular: Negative for chest pain and palpitations.  Musculoskeletal: Negative for neck pain.  Neurological: Negative for headaches.  All other systems reviewed and are negative.      Objective:   Physical Exam  Constitutional: She is oriented to person, place, and time. She appears well-developed and well-nourished.  HENT:  Nose: Nose normal.  Mouth/Throat: Oropharynx is clear and moist.  Eyes: EOM are normal.  Neck: Trachea normal, normal range of motion and full passive range of motion without pain. Neck supple. No JVD present. Carotid bruit is not present. No thyromegaly present.  Cardiovascular: Normal rate, regular rhythm, normal heart sounds  and intact distal pulses.  Exam reveals no gallop and no friction rub.   No murmur heard. Pulmonary/Chest: Effort normal and breath sounds normal.  Abdominal: Soft. Bowel sounds are normal. She exhibits no distension and no mass. There is no tenderness.  Musculoskeletal: Normal range of motion.  Lymphadenopathy:    She has no cervical adenopathy.  Neurological: She is alert and oriented to person, place, and time. She has normal reflexes.  Skin: Skin is warm and dry.  Psychiatric: She has a normal mood and affect. Her behavior is normal. Judgment and thought content normal.    BP 122/72 mmHg  Pulse 73  Temp(Src) 98.3 F (36.8 C) (Oral)  Ht 5' (1.524 m)  Wt 164 lb 12.8 oz (74.753 kg)  BMI 32.19 kg/m2  LMP 01/10/2015          Assessment & Plan:    1. Essential hypertension Do not add salt to diet - lisinopril (PRINIVIL,ZESTRIL) 20 MG tablet; TAKE ONE (1) TABLET EACH DAY  Dispense: 30 tablet; Refill: 5 - CMP14+EGFR  2. Hyperlipidemia with target LDL less than 100 Low fat diet - rosuvastatin (CRESTOR) 20 MG tablet; Take 1 tablet (20 mg total) by mouth daily.  Dispense: 30 tablet; Refill: 5 - NMR, lipoprofile  3. BMI 32.0-32.9,adult Discussed diet and exercise for person with BMI >25 Will recheck weight in 3-6 months     Labs pending Health maintenance reviewed Diet and exercise encouraged Continue all meds Follow up  In 3 months   [place], FNP

## 2015-01-12 LAB — CMP14+EGFR
ALK PHOS: 105 IU/L (ref 39–117)
ALT: 16 IU/L (ref 0–32)
AST: 32 IU/L (ref 0–40)
Albumin/Globulin Ratio: 1.5 (ref 1.1–2.5)
Albumin: 4.3 g/dL (ref 3.5–5.5)
BUN/Creatinine Ratio: 11 (ref 9–23)
BUN: 7 mg/dL (ref 6–24)
Bilirubin Total: 0.2 mg/dL (ref 0.0–1.2)
CALCIUM: 9.3 mg/dL (ref 8.7–10.2)
CO2: 25 mmol/L (ref 18–29)
Chloride: 101 mmol/L (ref 97–108)
Creatinine, Ser: 0.64 mg/dL (ref 0.57–1.00)
GFR calc non Af Amer: 107 mL/min/{1.73_m2} (ref >59)
GFR, EST AFRICAN AMERICAN: 124 mL/min/{1.73_m2} (ref >59)
GLOBULIN, TOTAL: 2.9 g/dL (ref 1.5–4.5)
Glucose: 81 mg/dL (ref 65–99)
POTASSIUM: 5.1 mmol/L (ref 3.5–5.2)
Sodium: 143 mmol/L (ref 134–144)
Total Protein: 7.2 g/dL (ref 6.0–8.5)

## 2015-01-12 LAB — NMR, LIPOPROFILE
Cholesterol: 116 mg/dL (ref 100–199)
HDL Cholesterol by NMR: 56 mg/dL (ref >39)
HDL Particle Number: 40.5 umol/L (ref >=30.5)
LDL Particle Number: 588 nmol/L (ref <1000)
LDL Size: 20.2 nm (ref >20.5)
LDL-C: 42 mg/dL (ref 0–99)
LP-IR SCORE: 49 — AB (ref <=45)
SMALL LDL PARTICLE NUMBER: 407 nmol/L (ref <=527)
Triglycerides by NMR: 90 mg/dL (ref 0–149)

## 2015-07-05 ENCOUNTER — Other Ambulatory Visit: Payer: Self-pay | Admitting: Nurse Practitioner

## 2015-07-12 ENCOUNTER — Ambulatory Visit (INDEPENDENT_AMBULATORY_CARE_PROVIDER_SITE_OTHER): Payer: BLUE CROSS/BLUE SHIELD | Admitting: Nurse Practitioner

## 2015-07-12 ENCOUNTER — Encounter: Payer: Self-pay | Admitting: Nurse Practitioner

## 2015-07-12 VITALS — BP 150/81 | HR 78 | Temp 97.5°F | Ht 60.0 in | Wt 169.0 lb

## 2015-07-12 DIAGNOSIS — Z6832 Body mass index (BMI) 32.0-32.9, adult: Secondary | ICD-10-CM | POA: Diagnosis not present

## 2015-07-12 DIAGNOSIS — E785 Hyperlipidemia, unspecified: Secondary | ICD-10-CM | POA: Diagnosis not present

## 2015-07-12 DIAGNOSIS — I10 Essential (primary) hypertension: Secondary | ICD-10-CM

## 2015-07-12 MED ORDER — ROSUVASTATIN CALCIUM 20 MG PO TABS: 20.0000 mg | ORAL_TABLET | Freq: Every day | ORAL | Status: DC

## 2015-07-12 MED ORDER — LISINOPRIL 20 MG PO TABS: 20.0000 mg | ORAL_TABLET | Freq: Every day | ORAL | Status: DC

## 2015-07-12 NOTE — Progress Notes (Signed)
  Subjective:    Patient ID: Grace Rodriguez, female    DOB: [birth year], 44 y.o.   MRN: [id number]  Patient here today for follow up of chronic medical problems.   Hypertension This is a chronic problem. The current episode started more than 1 year ago. The problem is unchanged. The problem is controlled. Pertinent negatives include no chest pain, headaches, neck pain, palpitations or shortness of breath. Risk factors for coronary artery disease include obesity and dyslipidemia. Past treatments include ACE inhibitors. The current treatment provides significant improvement. Compliance problems include exercise and diet.   Hyperlipidemia This is a chronic problem. The current episode started more than 1 year ago. The problem is controlled. Recent lipid tests were reviewed and are normal. She has no history of diabetes or hypothyroidism. Pertinent negatives include no chest pain or shortness of breath. Current antihyperlipidemic treatment includes statins. The current treatment provides significant improvement of lipids. Compliance problems include adherence to diet and adherence to exercise.  Risk factors for coronary artery disease include dyslipidemia and hypertension.      Review of Systems  Respiratory: Negative for shortness of breath.   Cardiovascular: Negative for chest pain and palpitations.  Musculoskeletal: Negative for neck pain.  Neurological: Negative for headaches.  All other systems reviewed and are negative.      Objective:   Physical Exam  Constitutional: She is oriented to person, place, and time. She appears well-developed and well-nourished.  HENT:  Nose: Nose normal.  Mouth/Throat: Oropharynx is clear and moist.  Eyes: EOM are normal.  Neck: Trachea normal, normal range of motion and full passive range of motion without pain. Neck supple. No JVD present. Carotid bruit is not present. No thyromegaly present.  Cardiovascular: Normal rate, regular rhythm, normal heart sounds  and intact distal pulses.  Exam reveals no gallop and no friction rub.   No murmur heard. Pulmonary/Chest: Effort normal and breath sounds normal.  Abdominal: Soft. Bowel sounds are normal. She exhibits no distension and no mass. There is no tenderness.  Musculoskeletal: Normal range of motion.  Lymphadenopathy:    She has no cervical adenopathy.  Neurological: She is alert and oriented to person, place, and time. She has normal reflexes.  Skin: Skin is warm and dry.  Psychiatric: She has a normal mood and affect. Her behavior is normal. Judgment and thought content normal.    BP 150/81 mmHg  Pulse 78  Temp(Src) 97.5 F (36.4 C) (Oral)  Ht 5' (1.524 m)  Wt 169 lb (76.658 kg)  BMI 33.01 kg/m2        Assessment & Plan:   1. Essential hypertension First time blood pressure has been elevated- going to have patiet keep diary of blood pressures at home and follow up in 2 weeks to recheck Avoid adding salt to diet - lisinopril (PRINIVIL,ZESTRIL) 20 MG tablet; Take 1 tablet (20 mg total) by mouth daily.  Dispense: 30 tablet; Refill: 5 - CMP14+EGFR  2. Hyperlipidemia with target LDL less than 100 Low fta diet - rosuvastatin (CRESTOR) 20 MG tablet; Take 1 tablet (20 mg total) by mouth daily.  Dispense: 30 tablet; Refill: 5 - Lipid panel  3. BMI 32.0-32.9,adult Discussed diet and exercise for person with BMI >25 Will recheck weight in 3-6 months     Labs pending Health maintenance reviewed Diet and exercise encouraged Continue all meds Follow up  In 2 blood pressure   Mary-Margaret Hassell Done, FNP

## 2015-07-12 NOTE — Patient Instructions (Signed)
Hypertension Hypertension, commonly called high blood pressure, is when the force of blood pumping through your arteries is too strong. Your arteries are the blood vessels that carry blood from your heart throughout your body. A blood pressure reading consists of a higher number over a lower number, such as 110/72. The higher number (systolic) is the pressure inside your arteries when your heart pumps. The lower number (diastolic) is the pressure inside your arteries when your heart relaxes. Ideally you want your blood pressure below 120/80. Hypertension forces your heart to work harder to pump blood. Your arteries may become narrow or stiff. Having untreated or uncontrolled hypertension can cause heart attack, stroke, kidney disease, and other problems. RISK FACTORS Some risk factors for high blood pressure are controllable. Others are not.  Risk factors you cannot control include:   Race. You may be at higher risk if you are African American.  Age. Risk increases with age.  Gender. Men are at higher risk than women before age 45 years. After age 65, women are at higher risk than men. Risk factors you can control include:  Not getting enough exercise or physical activity.  Being overweight.  Getting too much fat, sugar, calories, or salt in your diet.  Drinking too much alcohol. SIGNS AND SYMPTOMS Hypertension does not usually cause signs or symptoms. Extremely high blood pressure (hypertensive crisis) may cause headache, anxiety, shortness of breath, and nosebleed. DIAGNOSIS To check if you have hypertension, your health care provider will measure your blood pressure while you are seated, with your arm held at the level of your heart. It should be measured at least twice using the same arm. Certain conditions can cause a difference in blood pressure between your right and left arms. A blood pressure reading that is higher than normal on one occasion does not mean that you need treatment. If  it is not clear whether you have high blood pressure, you may be asked to return on a different day to have your blood pressure checked again. Or, you may be asked to monitor your blood pressure at home for 1 or more weeks. TREATMENT Treating high blood pressure includes making lifestyle changes and possibly taking medicine. Living a healthy lifestyle can help lower high blood pressure. You may need to change some of your habits. Lifestyle changes may include:  Following the DASH diet. This diet is high in fruits, vegetables, and whole grains. It is low in salt, red meat, and added sugars.  Keep your sodium intake below 2,300 mg per day.  Getting at least 30-45 minutes of aerobic exercise at least 4 times per week.  Losing weight if necessary.  Not smoking.  Limiting alcoholic beverages.  Learning ways to reduce stress. Your health care provider may prescribe medicine if lifestyle changes are not enough to get your blood pressure under control, and if one of the following is true:  You are 18-59 years of age and your systolic blood pressure is above 140.  You are 60 years of age or older, and your systolic blood pressure is above 150.  Your diastolic blood pressure is above 90.  You have diabetes, and your systolic blood pressure is over 140 or your diastolic blood pressure is over 90.  You have kidney disease and your blood pressure is above 140/90.  You have heart disease and your blood pressure is above 140/90. Your personal target blood pressure may vary depending on your medical conditions, your age, and other factors. HOME CARE INSTRUCTIONS    Have your blood pressure rechecked as directed by your health care provider.   Take medicines only as directed by your health care provider. Follow the directions carefully. Blood pressure medicines must be taken as prescribed. The medicine does not work as well when you skip doses. Skipping doses also puts you at risk for  problems.  Do not smoke.   Monitor your blood pressure at home as directed by your health care provider. SEEK MEDICAL CARE IF:   You think you are having a reaction to medicines taken.  You have recurrent headaches or feel dizzy.  You have swelling in your ankles.  You have trouble with your vision. SEEK IMMEDIATE MEDICAL CARE IF:  You develop a severe headache or confusion.  You have unusual weakness, numbness, or feel faint.  You have severe chest or abdominal pain.  You vomit repeatedly.  You have trouble breathing. MAKE SURE YOU:   Understand these instructions.  Will watch your condition.  Will get help right away if you are not doing well or get worse.   This information is not intended to replace advice given to you by your health care provider. Make sure you discuss any questions you have with your health care provider.   Document Released: 08/18/2005 Document Revised: 01/02/2015 Document Reviewed: 06/10/2013 Elsevier Interactive Patient Education 2016 [employer]  

## 2015-07-13 LAB — CMP14+EGFR
ALT: 16 IU/L (ref 0–32)
AST: 28 IU/L (ref 0–40)
Albumin/Globulin Ratio: 1.5 (ref 1.1–2.5)
Albumin: 4.5 g/dL (ref 3.5–5.5)
Alkaline Phosphatase: 117 IU/L (ref 39–117)
BUN/Creatinine Ratio: 13 (ref 9–23)
BUN: 8 mg/dL (ref 6–24)
Bilirubin Total: 0.3 mg/dL (ref 0.0–1.2)
CALCIUM: 9.3 mg/dL (ref 8.7–10.2)
CO2: 25 mmol/L (ref 18–29)
CREATININE: 0.64 mg/dL (ref 0.57–1.00)
Chloride: 96 mmol/L — ABNORMAL LOW (ref 97–106)
GFR calc Af Amer: 124 mL/min/{1.73_m2} (ref >59)
GFR, EST NON AFRICAN AMERICAN: 107 mL/min/{1.73_m2} (ref >59)
GLOBULIN, TOTAL: 3 g/dL (ref 1.5–4.5)
Glucose: 91 mg/dL (ref 65–99)
Potassium: 4.9 mmol/L (ref 3.5–5.2)
SODIUM: 139 mmol/L (ref 136–144)
TOTAL PROTEIN: 7.5 g/dL (ref 6.0–8.5)

## 2015-07-13 LAB — LIPID PANEL
Chol/HDL Ratio: 2.2 ratio units (ref 0.0–4.4)
Cholesterol, Total: 123 mg/dL (ref 100–199)
HDL: 56 mg/dL (ref >39)
LDL Calculated: 48 mg/dL (ref 0–99)
TRIGLYCERIDES: 93 mg/dL (ref 0–149)
VLDL Cholesterol Cal: 19 mg/dL (ref 5–40)

## 2015-07-16 NOTE — Progress Notes (Signed)
Patient informed. 

## 2015-07-30 ENCOUNTER — Encounter: Payer: Self-pay | Admitting: Nurse Practitioner

## 2015-07-30 ENCOUNTER — Ambulatory Visit (INDEPENDENT_AMBULATORY_CARE_PROVIDER_SITE_OTHER): Payer: BLUE CROSS/BLUE SHIELD | Admitting: Nurse Practitioner

## 2015-07-30 VITALS — BP 138/84 | HR 73 | Temp 97.2°F | Ht 60.0 in | Wt 169.0 lb

## 2015-07-30 DIAGNOSIS — I10 Essential (primary) hypertension: Secondary | ICD-10-CM | POA: Diagnosis not present

## 2015-07-30 NOTE — Patient Instructions (Signed)
Hypertension Hypertension, commonly called high blood pressure, is when the force of blood pumping through your arteries is too strong. Your arteries are the blood vessels that carry blood from your heart throughout your body. A blood pressure reading consists of a higher number over a lower number, such as 110/72. The higher number (systolic) is the pressure inside your arteries when your heart pumps. The lower number (diastolic) is the pressure inside your arteries when your heart relaxes. Ideally you want your blood pressure below 120/80. Hypertension forces your heart to work harder to pump blood. Your arteries may become narrow or stiff. Having untreated or uncontrolled hypertension can cause heart attack, stroke, kidney disease, and other problems. RISK FACTORS Some risk factors for high blood pressure are controllable. Others are not.  Risk factors you cannot control include:   Race. You may be at higher risk if you are African American.  Age. Risk increases with age.  Gender. Men are at higher risk than women before age 45 years. After age 65, women are at higher risk than men. Risk factors you can control include:  Not getting enough exercise or physical activity.  Being overweight.  Getting too much fat, sugar, calories, or salt in your diet.  Drinking too much alcohol. SIGNS AND SYMPTOMS Hypertension does not usually cause signs or symptoms. Extremely high blood pressure (hypertensive crisis) may cause headache, anxiety, shortness of breath, and nosebleed. DIAGNOSIS To check if you have hypertension, your health care provider will measure your blood pressure while you are seated, with your arm held at the level of your heart. It should be measured at least twice using the same arm. Certain conditions can cause a difference in blood pressure between your right and left arms. A blood pressure reading that is higher than normal on one occasion does not mean that you need treatment. If  it is not clear whether you have high blood pressure, you may be asked to return on a different day to have your blood pressure checked again. Or, you may be asked to monitor your blood pressure at home for 1 or more weeks. TREATMENT Treating high blood pressure includes making lifestyle changes and possibly taking medicine. Living a healthy lifestyle can help lower high blood pressure. You may need to change some of your habits. Lifestyle changes may include:  Following the DASH diet. This diet is high in fruits, vegetables, and whole grains. It is low in salt, red meat, and added sugars.  Keep your sodium intake below 2,300 mg per day.  Getting at least 30-45 minutes of aerobic exercise at least 4 times per week.  Losing weight if necessary.  Not smoking.  Limiting alcoholic beverages.  Learning ways to reduce stress. Your health care provider may prescribe medicine if lifestyle changes are not enough to get your blood pressure under control, and if one of the following is true:  You are 18-59 years of age and your systolic blood pressure is above 140.  You are 60 years of age or older, and your systolic blood pressure is above 150.  Your diastolic blood pressure is above 90.  You have diabetes, and your systolic blood pressure is over 140 or your diastolic blood pressure is over 90.  You have kidney disease and your blood pressure is above 140/90.  You have heart disease and your blood pressure is above 140/90. Your personal target blood pressure may vary depending on your medical conditions, your age, and other factors. HOME CARE INSTRUCTIONS    Have your blood pressure rechecked as directed by your health care provider.   Take medicines only as directed by your health care provider. Follow the directions carefully. Blood pressure medicines must be taken as prescribed. The medicine does not work as well when you skip doses. Skipping doses also puts you at risk for  problems.  Do not smoke.   Monitor your blood pressure at home as directed by your health care provider. SEEK MEDICAL CARE IF:   You think you are having a reaction to medicines taken.  You have recurrent headaches or feel dizzy.  You have swelling in your ankles.  You have trouble with your vision. SEEK IMMEDIATE MEDICAL CARE IF:  You develop a severe headache or confusion.  You have unusual weakness, numbness, or feel faint.  You have severe chest or abdominal pain.  You vomit repeatedly.  You have trouble breathing. MAKE SURE YOU:   Understand these instructions.  Will watch your condition.  Will get help right away if you are not doing well or get worse.   This information is not intended to replace advice given to you by your health care provider. Make sure you discuss any questions you have with your health care provider.   Document Released: 08/18/2005 Document Revised: 01/02/2015 Document Reviewed: 06/10/2013 Elsevier Interactive Patient Education 2016 [employer]  

## 2015-07-30 NOTE — Progress Notes (Signed)
   Subjective:    Patient ID: Grace Rodriguez M Bruins, female    DOB: [birth year], 44 y.o.   MRN: [id number]  HPI Patient was seen 07/12/15 with elevated blood pressure. Patient was to keep diary of blood pressure at home. Average blood pressure is 120 systolic. Is  On Lisinopril 20 mg daily.    Review of Systems  Constitutional: Negative.   HENT: Negative.   Respiratory: Negative.   Cardiovascular: Negative.   Genitourinary: Negative.   Neurological: Negative.   Psychiatric/Behavioral: Negative.   All other systems reviewed and are negative.      Objective:   Physical Exam  Constitutional: She is oriented to person, place, and time. She appears well-developed and well-nourished.  Cardiovascular: Normal rate, regular rhythm and normal heart sounds.   Pulmonary/Chest: Effort normal and breath sounds normal.  Neurological: She is alert and oriented to person, place, and time.  Skin: Skin is warm.  Psychiatric: She has a normal mood and affect. Her behavior is normal. Judgment and thought content normal.   BP 138/84 mmHg  Pulse 73  Temp(Src) 97.2 F (36.2 C) (Oral)  Ht 5' (1.524 m)  Wt 169 lb (76.658 kg)  BMI 33.01 kg/m2       Assessment & Plan:  1. Essential hypertension Continue lisinopril as rx Do not add salt to diet Follow up in 3 months  Mary-Margaret Daphine DeutscherMartin, FNP

## 2015-10-31 ENCOUNTER — Ambulatory Visit (INDEPENDENT_AMBULATORY_CARE_PROVIDER_SITE_OTHER): Payer: BLUE CROSS/BLUE SHIELD | Admitting: Nurse Practitioner

## 2015-10-31 ENCOUNTER — Encounter: Payer: Self-pay | Admitting: Nurse Practitioner

## 2015-10-31 VITALS — BP 129/78 | HR 90 | Temp 98.0°F | Ht 60.0 in | Wt 169.8 lb

## 2015-10-31 DIAGNOSIS — I10 Essential (primary) hypertension: Secondary | ICD-10-CM | POA: Diagnosis not present

## 2015-10-31 DIAGNOSIS — E785 Hyperlipidemia, unspecified: Secondary | ICD-10-CM

## 2015-10-31 DIAGNOSIS — Z9289 Personal history of other medical treatment: Secondary | ICD-10-CM | POA: Diagnosis not present

## 2015-10-31 DIAGNOSIS — Z6833 Body mass index (BMI) 33.0-33.9, adult: Secondary | ICD-10-CM

## 2015-10-31 MED ORDER — LISINOPRIL 20 MG PO TABS: 20.0000 mg | ORAL_TABLET | Freq: Every day | ORAL | Status: DC

## 2015-10-31 MED ORDER — ROSUVASTATIN CALCIUM 20 MG PO TABS: 20.0000 mg | ORAL_TABLET | Freq: Every day | ORAL | Status: DC

## 2015-10-31 NOTE — Patient Instructions (Addendum)
Thank you for allowing [place] to care for you today. We strive to provide exceptional quality and compassionate care. Please let us know how we are doing and how we can help serve you better by filling out the survey that you receive from [facility].  Health Maintenance, Female Adopting a healthy lifestyle and getting preventive care can go a long way to promote health and wellness. Talk with your health care provider about what schedule of regular examinations is right for you. This is a good chance for you to check in with your provider about disease prevention and staying healthy. In between checkups, there are plenty of things you can do on your own. Experts have done a lot of research about which lifestyle changes and preventive measures are most likely to keep you healthy. Ask your health care provider for more information. WEIGHT AND DIET  Eat a healthy diet  Be sure to include plenty of vegetables, fruits, low-fat dairy products, and lean protein.  Do not eat a lot of foods high in solid fats, added sugars, or salt.  Get regular exercise. This is one of the most important things you can do for your health.  Most adults should exercise for at least 150 minutes each week. The exercise should increase your heart rate and make you sweat (moderate-intensity exercise).  Most adults should also do strengthening exercises at least twice a week. This is in addition to the moderate-intensity exercise.  Maintain a healthy weight  Body mass index (BMI) is a measurement that can be used to identify possible weight problems. It estimates body fat based on height and weight. Your health care provider can help determine your BMI and help you achieve or maintain a healthy weight.  For females 48 years of age and older:   A BMI below 18.5 is considered underweight.  A BMI of 18.5 to 24.9 is normal.  A BMI of 25 to 29.9 is considered overweight.  A BMI of 30 and above is considered obese.  Watch  levels of cholesterol and blood lipids  You should start having your blood tested for lipids and cholesterol at 47 years of age, then have this test every 5 years.  You may need to have your cholesterol levels checked more often if:  Your lipid or cholesterol levels are high.  You are older than 47 years of age.  You are at high risk for heart disease.  CANCER SCREENING   Lung Cancer  Lung cancer screening is recommended for adults 2-56 years old who are at high risk for lung cancer because of a history of smoking.  A yearly low-dose CT scan of the lungs is recommended for people who:  Currently smoke.  Have quit within the past 15 years.  Have at least a 30-pack-year history of smoking. A pack year is smoking an average of one pack of cigarettes a day for 1 year.  Yearly screening should continue until it has been 15 years since you quit.  Yearly screening should stop if you develop a health problem that would prevent you from having lung cancer treatment.  Breast Cancer  Practice breast self-awareness. This means understanding how your breasts normally appear and feel.  It also means doing regular breast self-exams. Let your health care provider know about any changes, no matter how small.  If you are in your 20s or 30s, you should have a clinical breast exam (CBE) by a health care provider every 1-3 years as part of  a regular health exam.  If you are 27 or older, have a CBE every year. Also consider having a breast X-ray (mammogram) every year.  If you have a family history of breast cancer, talk to your health care provider about genetic screening.  If you are at high risk for breast cancer, talk to your health care provider about having an MRI and a mammogram every year.  Breast cancer gene (BRCA) assessment is recommended for women who have family members with BRCA-related cancers. BRCA-related cancers include:  Breast.  Ovarian.  Tubal.  Peritoneal  cancers.  Results of the assessment will determine the need for genetic counseling and BRCA1 and BRCA2 testing. Cervical Cancer Your health care provider may recommend that you be screened regularly for cancer of the pelvic organs (ovaries, uterus, and vagina). This screening involves a pelvic examination, including checking for microscopic changes to the surface of your cervix (Pap test). You may be encouraged to have this screening done every 3 years, beginning at age 24.  For women ages 65-65, health care providers may recommend pelvic exams and Pap testing every 3 years, or they may recommend the Pap and pelvic exam, combined with testing for human papilloma virus (HPV), every 5 years. Some types of HPV increase your risk of cervical cancer. Testing for HPV may also be done on women of any age with unclear Pap test results.  Other health care providers may not recommend any screening for nonpregnant women who are considered low risk for pelvic cancer and who do not have symptoms. Ask your health care provider if a screening pelvic exam is right for you.  If you have had past treatment for cervical cancer or a condition that could lead to cancer, you need Pap tests and screening for cancer for at least 20 years after your treatment. If Pap tests have been discontinued, your risk factors (such as having a new sexual partner) need to be reassessed to determine if screening should resume. Some women have medical problems that increase the chance of getting cervical cancer. In these cases, your health care provider may recommend more frequent screening and Pap tests. Colorectal Cancer  This type of cancer can be detected and often prevented.  Routine colorectal cancer screening usually begins at 47 years of age and continues through 47 years of age.  Your health care provider may recommend screening at an earlier age if you have risk factors for colon cancer.  Your health care provider may also  recommend using home test kits to check for hidden blood in the stool.  A small camera at the end of a tube can be used to examine your colon directly (sigmoidoscopy or colonoscopy). This is done to check for the earliest forms of colorectal cancer.  Routine screening usually begins at age 10.  Direct examination of the colon should be repeated every 5-10 years through 47 years of age. However, you may need to be screened more often if early forms of precancerous polyps or small growths are found. Skin Cancer  Check your skin from head to toe regularly.  Tell your health care provider about any new moles or changes in moles, especially if there is a change in a mole's shape or color.  Also tell your health care provider if you have a mole that is larger than the size of a pencil eraser.  Always use sunscreen. Apply sunscreen liberally and repeatedly throughout the day.  Protect yourself by wearing long sleeves, pants, a wide-brimmed  hat, and sunglasses whenever you are outside. HEART DISEASE, DIABETES, AND HIGH BLOOD PRESSURE   High blood pressure causes heart disease and increases the risk of stroke. High blood pressure is more likely to develop in:  People who have blood pressure in the high end of the normal range (130-139/85-89 mm Hg).  People who are overweight or obese.  People who are African American.  If you are 42-48 years of age, have your blood pressure checked every 3-5 years. If you are 103 years of age or older, have your blood pressure checked every year. You should have your blood pressure measured twice--once when you are at a hospital or clinic, and once when you are not at a hospital or clinic. Record the average of the two measurements. To check your blood pressure when you are not at a hospital or clinic, you can use:  An automated blood pressure machine at a pharmacy.  A home blood pressure monitor.  If you are between 39 years and 54 years old, ask your health  care provider if you should take aspirin to prevent strokes.  Have regular diabetes screenings. This involves taking a blood sample to check your fasting blood sugar level.  If you are at a normal weight and have a low risk for diabetes, have this test once every three years after 47 years of age.  If you are overweight and have a high risk for diabetes, consider being tested at a younger age or more often. PREVENTING INFECTION  Hepatitis B  If you have a higher risk for hepatitis B, you should be screened for this virus. You are considered at high risk for hepatitis B if:  You were born in a country where hepatitis B is common. Ask your health care provider which countries are considered high risk.  Your parents were born in a high-risk country, and you have not been immunized against hepatitis B (hepatitis B vaccine).  You have HIV or AIDS.  You use needles to inject street drugs.  You live with someone who has hepatitis B.  You have had sex with someone who has hepatitis B.  You get hemodialysis treatment.  You take certain medicines for conditions, including cancer, organ transplantation, and autoimmune conditions. Hepatitis C  Blood testing is recommended for:  Everyone born from [birth year] through 1967.  Anyone with known risk factors for hepatitis C. Sexually transmitted infections (STIs)  You should be screened for sexually transmitted infections (STIs) including gonorrhea and chlamydia if:  You are sexually active and are younger than 47 years of age.  You are older than 47 years of age and your health care provider tells you that you are at risk for this type of infection.  Your sexual activity has changed since you were last screened and you are at an increased risk for chlamydia or gonorrhea. Ask your health care provider if you are at risk.  If you do not have HIV, but are at risk, it may be recommended that you take a prescription medicine daily to prevent HIV  infection. This is called pre-exposure prophylaxis (PrEP). You are considered at risk if:  You are sexually active and do not regularly use condoms or know the HIV status of your partner(s).  You take drugs by injection.  You are sexually active with a partner who has HIV. Talk with your health care provider about whether you are at high risk of being infected with HIV. If you choose to begin PrEP, you  should first be tested for HIV. You should then be tested every 3 months for as long as you are taking PrEP.  PREGNANCY   If you are premenopausal and you may become pregnant, ask your health care provider about preconception counseling.  If you may become pregnant, take 400 to 800 micrograms (mcg) of folic acid every day.  If you want to prevent pregnancy, talk to your health care provider about birth control (contraception). OSTEOPOROSIS AND MENOPAUSE   Osteoporosis is a disease in which the bones lose minerals and strength with aging. This can result in serious bone fractures. Your risk for osteoporosis can be identified using a bone density scan.  If you are 29 years of age or older, or if you are at risk for osteoporosis and fractures, ask your health care provider if you should be screened.  Ask your health care provider whether you should take a calcium or vitamin D supplement to lower your risk for osteoporosis.  Menopause may have certain physical symptoms and risks.  Hormone replacement therapy may reduce some of these symptoms and risks. Talk to your health care provider about whether hormone replacement therapy is right for you.  HOME CARE INSTRUCTIONS   Schedule regular health, dental, and eye exams.  Stay current with your immunizations.   Do not use any tobacco products including cigarettes, chewing tobacco, or electronic cigarettes.  If you are pregnant, do not drink alcohol.  If you are breastfeeding, limit how much and how often you drink alcohol.  Limit  alcohol intake to no more than 1 drink per day for nonpregnant women. One drink equals 12 ounces of beer, 5 ounces of wine, or 1 ounces of hard liquor.  Do not use street drugs.  Do not share needles.  Ask your health care provider for help if you need support or information about quitting drugs.  Tell your health care provider if you often feel depressed.  Tell your health care provider if you have ever been abused or do not feel safe at home.   This information is not intended to replace advice given to you by your health care provider. Make sure you discuss any questions you have with your health care provider.   Document Released: 03/03/2011 Document Revised: 09/08/2014 Document Reviewed: 07/20/2013 Elsevier Interactive Patient Education [employer].

## 2015-10-31 NOTE — Progress Notes (Signed)
Subjective:    Patient ID: Grace Rodriguez, female    DOB: [birth year], 45 y.o.   MRN: [id number]   Patient here today for follow up of chronic medical problems.  Outpatient Encounter Prescriptions as of 10/31/2015  Medication Sig  . lisinopril (PRINIVIL,ZESTRIL) 20 MG tablet Take 1 tablet (20 mg total) by mouth daily.  . rosuvastatin (CRESTOR) 20 MG tablet Take 1 tablet (20 mg total) by mouth daily.   No facility-administered encounter medications on file as of 10/31/2015.    Hyperlipidemia This is a chronic problem. The current episode started more than 1 year ago. The problem is controlled. Recent lipid tests were reviewed and are normal. She has no history of diabetes or hypothyroidism. Pertinent negatives include no chest pain or shortness of breath. Current antihyperlipidemic treatment includes statins. The current treatment provides significant improvement of lipids. Compliance problems include adherence to diet and adherence to exercise.  Risk factors for coronary artery disease include dyslipidemia and hypertension.  Hypertension This is a chronic problem. The current episode started more than 1 year ago. The problem is unchanged. The problem is controlled. Pertinent negatives include no chest pain, headaches, neck pain, palpitations or shortness of breath. Risk factors for coronary artery disease include obesity and dyslipidemia. Past treatments include ACE inhibitors. The current treatment provides significant improvement. Compliance problems include exercise and diet.       Review of Systems  Constitutional: Negative.   HENT: Negative.   Respiratory: Negative for shortness of breath.   Cardiovascular: Negative for chest pain and palpitations.  Gastrointestinal: Negative.   Genitourinary: Negative.   Musculoskeletal: Negative for neck pain.  Neurological: Negative.  Negative for headaches.  Psychiatric/Behavioral: Negative.   All other systems reviewed and are negative.        Objective:   Physical Exam  Constitutional: She is oriented to person, place, and time. She appears well-developed and well-nourished.  HENT:  Nose: Nose normal.  Mouth/Throat: Oropharynx is clear and moist.  Eyes: EOM are normal.  Neck: Trachea normal, normal range of motion and full passive range of motion without pain. Neck supple. No JVD present. Carotid bruit is not present. No thyromegaly present.  Cardiovascular: Normal rate, regular rhythm, normal heart sounds and intact distal pulses.  Exam reveals no gallop and no friction rub.   No murmur heard. Pulmonary/Chest: Effort normal and breath sounds normal.  Abdominal: Soft. Bowel sounds are normal. She exhibits no distension and no mass. There is no tenderness.  Musculoskeletal: Normal range of motion.  Lymphadenopathy:    She has no cervical adenopathy.  Neurological: She is alert and oriented to person, place, and time. She has normal reflexes.  Skin: Skin is warm and dry.  Psychiatric: She has a normal mood and affect. Her behavior is normal. Judgment and thought content normal.   BP 129/78 mmHg  Pulse 90  Temp(Src) 98 F (36.7 C) (Oral)  Ht 5' (1.524 m)  Wt 169 lb 12.8 oz (77.021 kg)  BMI 33.16 kg/m2        Assessment & Plan:  1. Essential hypertension Do ot add salt to diet - lisinopril (PRINIVIL,ZESTRIL) 20 MG tablet; Take 1 tablet (20 mg total) by mouth daily.  Dispense: 30 tablet; Refill: 5 - CMP14+EGFR  2. Hyperlipidemia with target LDL less than 100 Low fat diet - rosuvastatin (CRESTOR) 20 MG tablet; Take 1 tablet (20 mg total) by mouth daily.  Dispense: 30 tablet; Refill: 5 - Lipid panel  3. BMI 33.0-33.9,adult Discussed diet  and exercise for person with BMI >25 Will recheck weight in 3-6 months  4. Hx of mammogram - MM Digital Screening; Future    Labs pending Health maintenance reviewed Diet and exercise encouraged Continue all meds Follow up  In 6 month   [place], FNP

## 2015-11-01 LAB — CMP14+EGFR
A/G RATIO: 1.6 (ref 1.1–2.5)
ALT: 31 IU/L (ref 0–32)
AST: 44 IU/L — AB (ref 0–40)
Albumin: 4.5 g/dL (ref 3.5–5.5)
Alkaline Phosphatase: 124 IU/L — ABNORMAL HIGH (ref 39–117)
BILIRUBIN TOTAL: 0.2 mg/dL (ref 0.0–1.2)
BUN/Creatinine Ratio: 14 (ref 9–23)
BUN: 9 mg/dL (ref 6–24)
CHLORIDE: 98 mmol/L (ref 96–106)
CO2: 27 mmol/L (ref 18–29)
Calcium: 9.4 mg/dL (ref 8.7–10.2)
Creatinine, Ser: 0.63 mg/dL (ref 0.57–1.00)
GFR calc non Af Amer: 108 mL/min/{1.73_m2} (ref >59)
GFR, EST AFRICAN AMERICAN: 124 mL/min/{1.73_m2} (ref >59)
Globulin, Total: 2.9 g/dL (ref 1.5–4.5)
Glucose: 96 mg/dL (ref 65–99)
POTASSIUM: 4.6 mmol/L (ref 3.5–5.2)
Sodium: 142 mmol/L (ref 134–144)
Total Protein: 7.4 g/dL (ref 6.0–8.5)

## 2015-11-01 LAB — LIPID PANEL
Chol/HDL Ratio: 2.9 ratio units (ref 0.0–4.4)
Cholesterol, Total: 132 mg/dL (ref 100–199)
HDL: 45 mg/dL (ref >39)
LDL Calculated: 66 mg/dL (ref 0–99)
TRIGLYCERIDES: 103 mg/dL (ref 0–149)
VLDL Cholesterol Cal: 21 mg/dL (ref 5–40)

## 2016-01-29 ENCOUNTER — Encounter: Payer: BLUE CROSS/BLUE SHIELD | Admitting: *Deleted

## 2016-01-29 DIAGNOSIS — Z1231 Encounter for screening mammogram for malignant neoplasm of breast: Secondary | ICD-10-CM | POA: Diagnosis not present

## 2016-01-30 LAB — HM MAMMOGRAPHY

## 2016-01-31 ENCOUNTER — Ambulatory Visit: Payer: BLUE CROSS/BLUE SHIELD | Admitting: Nurse Practitioner

## 2016-01-31 ENCOUNTER — Other Ambulatory Visit: Payer: Self-pay | Admitting: Nurse Practitioner

## 2016-01-31 DIAGNOSIS — I10 Essential (primary) hypertension: Secondary | ICD-10-CM

## 2016-02-01 MED ORDER — LISINOPRIL 20 MG PO TABS: 20.0000 mg | ORAL_TABLET | Freq: Every day | ORAL | Status: DC

## 2016-02-25 ENCOUNTER — Telehealth: Payer: Self-pay | Admitting: Nurse Practitioner

## 2016-02-25 DIAGNOSIS — N6489 Other specified disorders of breast: Secondary | ICD-10-CM

## 2016-02-26 DIAGNOSIS — R928 Other abnormal and inconclusive findings on diagnostic imaging of breast: Secondary | ICD-10-CM | POA: Diagnosis not present

## 2016-02-26 DIAGNOSIS — N6489 Other specified disorders of breast: Secondary | ICD-10-CM | POA: Diagnosis not present

## 2016-02-26 NOTE — Telephone Encounter (Signed)
[facility] on this patient and doesn't have her scheduled. [facility] and her follow up may be scheduled there.  I left a message with [facility] to call back and speak with Grace Rodriguez since I will be out of the office for most of the day.  It appears that they need an order for a bilateral diagnostic mammogram and ultrasound if needed for an abnormal screening mammogram.  External orders placed.

## 2016-02-28 NOTE — Telephone Encounter (Signed)
Order sent.

## 2016-03-19 ENCOUNTER — Ambulatory Visit (INDEPENDENT_AMBULATORY_CARE_PROVIDER_SITE_OTHER): Payer: BLUE CROSS/BLUE SHIELD | Admitting: Nurse Practitioner

## 2016-03-19 ENCOUNTER — Encounter: Payer: Self-pay | Admitting: Nurse Practitioner

## 2016-03-19 VITALS — BP 118/78 | HR 95 | Temp 97.7°F | Ht 60.0 in | Wt 168.0 lb

## 2016-03-19 DIAGNOSIS — I10 Essential (primary) hypertension: Secondary | ICD-10-CM | POA: Diagnosis not present

## 2016-03-19 DIAGNOSIS — Z6832 Body mass index (BMI) 32.0-32.9, adult: Secondary | ICD-10-CM | POA: Diagnosis not present

## 2016-03-19 DIAGNOSIS — E785 Hyperlipidemia, unspecified: Secondary | ICD-10-CM

## 2016-03-19 MED ORDER — ROSUVASTATIN CALCIUM 20 MG PO TABS: 20.0000 mg | ORAL_TABLET | Freq: Every day | ORAL | Status: DC

## 2016-03-19 MED ORDER — LISINOPRIL 20 MG PO TABS: 20.0000 mg | ORAL_TABLET | Freq: Every day | ORAL | Status: DC

## 2016-03-19 NOTE — Progress Notes (Signed)
Subjective:    Patient ID: Grace Rodriguez, female    DOB: Jul 23, 1971, 45 y.o.   MRN: [id number]   Patient here today for follow up of chronic medical problems.  Outpatient Encounter Prescriptions as of 03/19/2016  Medication Sig  . lisinopril (PRINIVIL,ZESTRIL) 20 MG tablet Take 1 tablet (20 mg total) by mouth daily.  . rosuvastatin (CRESTOR) 20 MG tablet Take 1 tablet (20 mg total) by mouth daily.   No facility-administered encounter medications on file as of 03/19/2016.   * sore lesion popped up on her left index finger- has been there 6 weeks and has gotten a little bigger.  Hyperlipidemia This is a chronic problem. The current episode started more than 1 year ago. The problem is controlled. Recent lipid tests were reviewed and are normal. She has no history of diabetes or hypothyroidism. Pertinent negatives include no chest pain or shortness of breath. Current antihyperlipidemic treatment includes statins. The current treatment provides significant improvement of lipids. Compliance problems include adherence to diet and adherence to exercise.  Risk factors for coronary artery disease include dyslipidemia and hypertension.  Hypertension This is a chronic problem. The current episode started more than 1 year ago. The problem is unchanged. The problem is controlled. Pertinent negatives include no chest pain, headaches, neck pain, palpitations or shortness of breath. Risk factors for coronary artery disease include obesity and dyslipidemia. Past treatments include ACE inhibitors. The current treatment provides significant improvement. Compliance problems include exercise and diet.       Review of Systems  Constitutional: Negative.   HENT: Negative.   Respiratory: Negative for shortness of breath.   Cardiovascular: Negative for chest pain and palpitations.  Gastrointestinal: Negative.   Genitourinary: Negative.   Musculoskeletal: Negative for neck pain.  Neurological: Negative.  Negative  for headaches.  Psychiatric/Behavioral: Negative.   All other systems reviewed and are negative.      Objective:   Physical Exam  Constitutional: She is oriented to person, place, and time. She appears well-developed and well-nourished.  HENT:  Nose: Nose normal.  Mouth/Throat: Oropharynx is clear and moist.  Eyes: EOM are normal.  Neck: Trachea normal, normal range of motion and full passive range of motion without pain. Neck supple. No JVD present. Carotid bruit is not present. No thyromegaly present.  Cardiovascular: Normal rate, regular rhythm, normal heart sounds and intact distal pulses.  Exam reveals no gallop and no friction rub.   No murmur heard. Pulmonary/Chest: Effort normal and breath sounds normal.  Abdominal: Soft. Bowel sounds are normal. She exhibits no distension and no mass. There is no tenderness.  Musculoskeletal: Normal range of motion.  Lymphadenopathy:    She has no cervical adenopathy.  Neurological: She is alert and oriented to person, place, and time. She has normal reflexes.  Skin: Skin is warm and dry.  Fluid filled lesion on left middle finger  Psychiatric: She has a normal mood and affect. Her behavior is normal. Judgment and thought content normal.   BP 118/78 mmHg  Pulse 95  Temp(Src) 97.7 F (36.5 C) (Oral)  Ht 5' (1.524 m)  Wt 168 lb (76.204 kg)  BMI 32.81 kg/m2        Assessment & Plan:  1. Essential hypertension Do not add salt to diet - lisinopril (PRINIVIL,ZESTRIL) 20 MG tablet; Take 1 tablet (20 mg total) by mouth daily.  Dispense: 30 tablet; Refill: 1 - CMP14+EGFR  2. Hyperlipidemia with target LDL less than 100 Low fat diet - rosuvastatin (CRESTOR) 20  MG tablet; Take 1 tablet (20 mg total) by mouth daily.  Dispense: 30 tablet; Refill: 5 - Lipid panel  3. BMI 32.0-32.9,adult Discussed diet and exercise for person with BMI >25 Will recheck weight in 3-6 months  [place], FNP

## 2016-03-20 LAB — LIPID PANEL
Chol/HDL Ratio: 2.7 ratio units (ref 0.0–4.4)
Cholesterol, Total: 129 mg/dL (ref 100–199)
HDL: 47 mg/dL (ref >39)
LDL Calculated: 58 mg/dL (ref 0–99)
TRIGLYCERIDES: 119 mg/dL (ref 0–149)
VLDL Cholesterol Cal: 24 mg/dL (ref 5–40)

## 2016-03-20 LAB — CMP14+EGFR
A/G RATIO: 1.6 (ref 1.2–2.2)
ALT: 20 IU/L (ref 0–32)
AST: 18 IU/L (ref 0–40)
Albumin: 4.5 g/dL (ref 3.5–5.5)
Alkaline Phosphatase: 115 IU/L (ref 39–117)
BILIRUBIN TOTAL: 0.3 mg/dL (ref 0.0–1.2)
BUN/Creatinine Ratio: 20 (ref 9–23)
BUN: 14 mg/dL (ref 6–24)
CHLORIDE: 98 mmol/L (ref 96–106)
CO2: 26 mmol/L (ref 18–29)
Calcium: 9.3 mg/dL (ref 8.7–10.2)
Creatinine, Ser: 0.7 mg/dL (ref 0.57–1.00)
GFR calc Af Amer: 119 mL/min/{1.73_m2} (ref >59)
GFR calc non Af Amer: 104 mL/min/{1.73_m2} (ref >59)
GLOBULIN, TOTAL: 2.9 g/dL (ref 1.5–4.5)
Glucose: 85 mg/dL (ref 65–99)
POTASSIUM: 3.9 mmol/L (ref 3.5–5.2)
SODIUM: 141 mmol/L (ref 134–144)
Total Protein: 7.4 g/dL (ref 6.0–8.5)

## 2016-04-07 ENCOUNTER — Other Ambulatory Visit: Payer: Self-pay | Admitting: Nurse Practitioner

## 2016-04-07 ENCOUNTER — Encounter: Payer: Self-pay | Admitting: Nurse Practitioner

## 2016-04-07 ENCOUNTER — Ambulatory Visit (INDEPENDENT_AMBULATORY_CARE_PROVIDER_SITE_OTHER): Payer: BLUE CROSS/BLUE SHIELD | Admitting: Nurse Practitioner

## 2016-04-07 VITALS — BP 118/76 | HR 80 | Temp 97.3°F | Ht 60.0 in | Wt 166.0 lb

## 2016-04-07 DIAGNOSIS — IMO0002 Reserved for concepts with insufficient information to code with codable children: Secondary | ICD-10-CM

## 2016-04-07 DIAGNOSIS — L988 Other specified disorders of the skin and subcutaneous tissue: Secondary | ICD-10-CM

## 2016-04-07 DIAGNOSIS — M7130 Other bursal cyst, unspecified site: Secondary | ICD-10-CM | POA: Diagnosis not present

## 2016-04-07 NOTE — Addendum Note (Signed)
Addended by: Caryl BisBOWMAN, Kendel Pesnell M on: 04/07/2016 05:03 PM   Modules accepted: Orders

## 2016-04-07 NOTE — Progress Notes (Signed)
   Subjective:    Patient ID: Grace Rodriguez, female    DOB: [birth year], 45 y.o.   MRN: [id number]  HPI Patient has a cyst on her finger that will not go away- very sore to toch- wants removed.    Review of Systems  Constitutional: Negative.   HENT: Negative.   Respiratory: Negative.   Cardiovascular: Negative.   Genitourinary: Negative.   Neurological: Negative.   Psychiatric/Behavioral: Negative.   All other systems reviewed and are negative.      Objective:   Physical Exam  Constitutional: She appears well-developed and well-nourished.  Cardiovascular: Normal rate, regular rhythm and normal heart sounds.   Pulmonary/Chest: Effort normal and breath sounds normal.  Skin:  Clear fluid filled lesion at distal PIP joint of left middle finger   BP 118/76 (BP Location: Right Arm, Patient Position: Sitting, Cuff Size: Normal)   Pulse 80   Temp 97.3 F (36.3 C) (Oral)   Ht 5' (1.524 m)   Wt 166 lb (75.3 kg)   BMI 32.42 kg/m   Procedure  Lidocaine 1% plain- 1ml local  Cyst shaved off with #15 blade  silvernitrate stick for cauterization  Cleaned with NACL  Dressing applied        Assessment & Plan:   1. Cyst of finger    Left middle finger Keep clean and dry Cyst sent for pathology RTO prn  Mary-Margaret Daphine DeutscherMartin, FNP

## 2016-04-10 LAB — PATHOLOGY

## 2016-06-20 ENCOUNTER — Ambulatory Visit (INDEPENDENT_AMBULATORY_CARE_PROVIDER_SITE_OTHER): Payer: BLUE CROSS/BLUE SHIELD | Admitting: Nurse Practitioner

## 2016-06-20 ENCOUNTER — Encounter: Payer: Self-pay | Admitting: Nurse Practitioner

## 2016-06-20 VITALS — BP 144/8 | HR 74 | Temp 98.3°F | Ht 60.0 in | Wt 162.0 lb

## 2016-06-20 DIAGNOSIS — Z6832 Body mass index (BMI) 32.0-32.9, adult: Secondary | ICD-10-CM

## 2016-06-20 DIAGNOSIS — E785 Hyperlipidemia, unspecified: Secondary | ICD-10-CM

## 2016-06-20 DIAGNOSIS — I10 Essential (primary) hypertension: Secondary | ICD-10-CM

## 2016-06-20 MED ORDER — LISINOPRIL 20 MG PO TABS: 20.0000 mg | ORAL_TABLET | Freq: Every day | ORAL | 1 refills | Status: DC

## 2016-06-20 MED ORDER — ROSUVASTATIN CALCIUM 20 MG PO TABS: 20.0000 mg | ORAL_TABLET | Freq: Every day | ORAL | 5 refills | Status: DC

## 2016-06-20 NOTE — Patient Instructions (Signed)

## 2016-06-20 NOTE — Progress Notes (Signed)
Subjective:    Patient ID: Grace Rodriguez, female    DOB: [birth year], 45 y.o.   MRN: [id number]   Patient here today for follow up of chronic medical problems. No chnages since last visit. No complaints today.  Outpatient Encounter Prescriptions as of 06/20/2016  Medication Sig  . lisinopril (PRINIVIL,ZESTRIL) 20 MG tablet Take 1 tablet (20 mg total) by mouth daily.  . rosuvastatin (CRESTOR) 20 MG tablet Take 1 tablet (20 mg total) by mouth daily.   No facility-administered encounter medications on file as of 06/20/2016.     Hyperlipidemia  This is a chronic problem. The current episode started more than 1 year ago. The problem is controlled. Recent lipid tests were reviewed and are normal. She has no history of diabetes or hypothyroidism. Pertinent negatives include no chest pain or shortness of breath. Current antihyperlipidemic treatment includes statins. The current treatment provides significant improvement of lipids. Compliance problems include adherence to diet and adherence to exercise.  Risk factors for coronary artery disease include dyslipidemia and hypertension.  Hypertension  This is a chronic problem. The current episode started more than 1 year ago. The problem is unchanged. The problem is controlled. Pertinent negatives include no chest pain, headaches, neck pain, palpitations or shortness of breath. Risk factors for coronary artery disease include obesity and dyslipidemia. Past treatments include ACE inhibitors. The current treatment provides significant improvement. Compliance problems include exercise and diet.       Review of Systems  Constitutional: Negative.   HENT: Negative.   Respiratory: Negative for shortness of breath.   Cardiovascular: Negative for chest pain and palpitations.  Gastrointestinal: Negative.   Genitourinary: Negative.   Musculoskeletal: Negative for neck pain.  Neurological: Negative.  Negative for headaches.  Psychiatric/Behavioral: Negative.    All other systems reviewed and are negative.      Objective:   Physical Exam  Constitutional: She is oriented to person, place, and time. She appears well-developed and well-nourished.  HENT:  Nose: Nose normal.  Mouth/Throat: Oropharynx is clear and moist.  Eyes: EOM are normal.  Neck: Trachea normal, normal range of motion and full passive range of motion without pain. Neck supple. No JVD present. Carotid bruit is not present. No thyromegaly present.  Cardiovascular: Normal rate, regular rhythm, normal heart sounds and intact distal pulses.  Exam reveals no gallop and no friction rub.   No murmur heard. Pulmonary/Chest: Effort normal and breath sounds normal.  Abdominal: Soft. Bowel sounds are normal. She exhibits no distension and no mass. There is no tenderness.  Musculoskeletal: Normal range of motion.  Lymphadenopathy:    She has no cervical adenopathy.  Neurological: She is alert and oriented to person, place, and time. She has normal reflexes.  Skin: Skin is warm and dry.  Fluid filled lesion on left middle finger  Psychiatric: She has a normal mood and affect. Her behavior is normal. Judgment and thought content normal.   BP (!) 144/8 (BP Location: Left Arm, Cuff Size: Normal)   Pulse 74   Temp 98.3 F (36.8 C) (Oral)   Ht 5' (1.524 m)   Wt 162 lb (73.5 kg)   BMI 31.64 kg/m        Assessment & Plan:  1. Essential hypertension Do not add salt to diet - lisinopril (PRINIVIL,ZESTRIL) 20 MG tablet; Take 1 tablet (20 mg total) by mouth daily.  Dispense: 30 tablet; Refill: 1 - CMP14+EGFR  2. BMI 32.0-32.9,adult Discussed diet and exercise for person with BMI >25 Will  recheck weight in 3-6 months  3. Hyperlipidemia with target LDL less than 100 Low fat diet - rosuvastatin (CRESTOR) 20 MG tablet; Take 1 tablet (20 mg total) by mouth daily.  Dispense: 30 tablet; Refill: 5 - Lipid panel    Labs pending Health maintenance reviewed Diet and exercise  encouraged Continue all meds Follow up  In 6 month   [place], FNP

## 2016-06-26 ENCOUNTER — Telehealth: Payer: Self-pay | Admitting: Nurse Practitioner

## 2016-06-26 NOTE — Telephone Encounter (Signed)
Needs to RTO for labs

## 2016-06-26 NOTE — Telephone Encounter (Signed)
Detailed message left for patient that she needs to return to office and have labs drawn.

## 2016-07-07 DIAGNOSIS — H5213 Myopia, bilateral: Secondary | ICD-10-CM | POA: Diagnosis not present

## 2016-08-26 ENCOUNTER — Other Ambulatory Visit: Payer: Self-pay | Admitting: Nurse Practitioner

## 2016-08-26 DIAGNOSIS — I10 Essential (primary) hypertension: Secondary | ICD-10-CM

## 2016-11-06 ENCOUNTER — Telehealth: Payer: Self-pay

## 2016-12-16 ENCOUNTER — Other Ambulatory Visit: Payer: Self-pay | Admitting: Nurse Practitioner

## 2016-12-16 DIAGNOSIS — I10 Essential (primary) hypertension: Secondary | ICD-10-CM

## 2016-12-23 ENCOUNTER — Ambulatory Visit (INDEPENDENT_AMBULATORY_CARE_PROVIDER_SITE_OTHER): Payer: BLUE CROSS/BLUE SHIELD | Admitting: Nurse Practitioner

## 2016-12-23 ENCOUNTER — Encounter: Payer: Self-pay | Admitting: Nurse Practitioner

## 2016-12-23 VITALS — BP 132/84 | HR 70 | Temp 96.8°F | Ht 60.0 in | Wt 146.0 lb

## 2016-12-23 DIAGNOSIS — I10 Essential (primary) hypertension: Secondary | ICD-10-CM | POA: Diagnosis not present

## 2016-12-23 DIAGNOSIS — E785 Hyperlipidemia, unspecified: Secondary | ICD-10-CM

## 2016-12-23 DIAGNOSIS — Z6828 Body mass index (BMI) 28.0-28.9, adult: Secondary | ICD-10-CM

## 2016-12-23 DIAGNOSIS — L989 Disorder of the skin and subcutaneous tissue, unspecified: Secondary | ICD-10-CM

## 2016-12-23 MED ORDER — ROSUVASTATIN CALCIUM 20 MG PO TABS: 20.0000 mg | ORAL_TABLET | Freq: Every day | ORAL | 5 refills | Status: DC

## 2016-12-23 MED ORDER — LISINOPRIL 20 MG PO TABS: 20.0000 mg | ORAL_TABLET | Freq: Every day | ORAL | 1 refills | Status: DC

## 2016-12-23 NOTE — Patient Instructions (Signed)
DASH Eating Plan DASH stands for "Dietary Approaches to Stop Hypertension." The DASH eating plan is a healthy eating plan that has been shown to reduce high blood pressure (hypertension). It may also reduce your risk for type 2 diabetes, heart disease, and stroke. The DASH eating plan may also help with weight loss. What are tips for following this plan? General guidelines  Avoid eating more than 2,300 mg (milligrams) of salt (sodium) a day. If you have hypertension, you may need to reduce your sodium intake to 1,500 mg a day.  Limit alcohol intake to no more than 1 drink a day for nonpregnant women and 2 drinks a day for men. One drink equals 12 oz of beer, 5 oz of wine, or 1 oz of hard liquor.  Work with your health care provider to maintain a healthy body weight or to lose weight. Ask what an ideal weight is for you.  Get at least 30 minutes of exercise that causes your heart to beat faster (aerobic exercise) most days of the week. Activities may include walking, swimming, or biking.  Work with your health care provider or diet and nutrition specialist (dietitian) to adjust your eating plan to your individual calorie needs. Reading food labels  Check food labels for the amount of sodium per serving. Choose foods with less than 5 percent of the Daily Value of sodium. Generally, foods with less than 300 mg of sodium per serving fit into this eating plan.  To find whole grains, look for the word "whole" as the first word in the ingredient list. Shopping  Buy products labeled as "low-sodium" or "no salt added."  Buy fresh foods. Avoid canned foods and premade or frozen meals. Cooking  Avoid adding salt when cooking. Use salt-free seasonings or herbs instead of table salt or sea salt. Check with your health care provider or pharmacist before using salt substitutes.  Do not fry foods. Cook foods using healthy methods such as baking, boiling, grilling, and broiling instead.  Cook with  heart-healthy oils, such as olive, canola, soybean, or sunflower oil. Meal planning   Eat a balanced diet that includes: ? 5 or more servings of fruits and vegetables each day. At each meal, try to fill half of your plate with fruits and vegetables. ? Up to 6-8 servings of whole grains each day. ? Less than 6 oz of lean meat, poultry, or fish each day. A 3-oz serving of meat is about the same size as a deck of cards. One egg equals 1 oz. ? 2 servings of low-fat dairy each day. ? A serving of nuts, seeds, or beans 5 times each week. ? Heart-healthy fats. Healthy fats called Omega-3 fatty acids are found in foods such as flaxseeds and coldwater fish, like sardines, salmon, and mackerel.  Limit how much you eat of the following: ? Canned or prepackaged foods. ? Food that is high in trans fat, such as fried foods. ? Food that is high in saturated fat, such as fatty meat. ? Sweets, desserts, sugary drinks, and other foods with added sugar. ? Full-fat dairy products.  Do not salt foods before eating.  Try to eat at least 2 vegetarian meals each week.  Eat more home-cooked food and less restaurant, buffet, and fast food.  When eating at a restaurant, ask that your food be prepared with less salt or no salt, if possible. What foods are recommended? The items listed may not be a complete list. Talk with your dietitian about what   dietary choices are best for you. Grains Whole-grain or whole-wheat bread. Whole-grain or whole-wheat pasta. Brown rice. Oatmeal. Quinoa. Bulgur. Whole-grain and low-sodium cereals. Pita bread. Low-fat, low-sodium crackers. Whole-wheat flour tortillas. Vegetables Fresh or frozen vegetables (raw, steamed, roasted, or grilled). Low-sodium or reduced-sodium tomato and vegetable juice. Low-sodium or reduced-sodium tomato sauce and tomato paste. Low-sodium or reduced-sodium canned vegetables. Fruits All fresh, dried, or frozen fruit. Canned fruit in natural juice (without  added sugar). Meat and other protein foods Skinless chicken or turkey. Ground chicken or turkey. Pork with fat trimmed off. Fish and seafood. Egg whites. Dried beans, peas, or lentils. Unsalted nuts, nut butters, and seeds. Unsalted canned beans. Lean cuts of beef with fat trimmed off. Low-sodium, lean deli meat. Dairy Low-fat (1%) or fat-free (skim) milk. Fat-free, low-fat, or reduced-fat cheeses. Nonfat, low-sodium ricotta or cottage cheese. Low-fat or nonfat yogurt. Low-fat, low-sodium cheese. Fats and oils Soft margarine without trans fats. Vegetable oil. Low-fat, reduced-fat, or light mayonnaise and salad dressings (reduced-sodium). Canola, safflower, olive, soybean, and sunflower oils. Avocado. Seasoning and other foods Herbs. Spices. Seasoning mixes without salt. Unsalted popcorn and pretzels. Fat-free sweets. What foods are not recommended? The items listed may not be a complete list. Talk with your dietitian about what dietary choices are best for you. Grains Baked goods made with fat, such as croissants, muffins, or some breads. Dry pasta or rice meal packs. Vegetables Creamed or fried vegetables. Vegetables in a cheese sauce. Regular canned vegetables (not low-sodium or reduced-sodium). Regular canned tomato sauce and paste (not low-sodium or reduced-sodium). Regular tomato and vegetable juice (not low-sodium or reduced-sodium). Pickles. Olives. Fruits Canned fruit in a light or heavy syrup. Fried fruit. Fruit in cream or butter sauce. Meat and other protein foods Fatty cuts of meat. Ribs. Fried meat. Bacon. Sausage. Bologna and other processed lunch meats. Salami. Fatback. Hotdogs. Bratwurst. Salted nuts and seeds. Canned beans with added salt. Canned or smoked fish. Whole eggs or egg yolks. Chicken or turkey with skin. Dairy Whole or 2% milk, cream, and half-and-half. Whole or full-fat cream cheese. Whole-fat or sweetened yogurt. Full-fat cheese. Nondairy creamers. Whipped toppings.  Processed cheese and cheese spreads. Fats and oils Butter. Stick margarine. Lard. Shortening. Ghee. Bacon fat. Tropical oils, such as coconut, palm kernel, or palm oil. Seasoning and other foods Salted popcorn and pretzels. Onion salt, garlic salt, seasoned salt, table salt, and sea salt. Worcestershire sauce. Tartar sauce. Barbecue sauce. Teriyaki sauce. Soy sauce, including reduced-sodium. Steak sauce. Canned and packaged gravies. Fish sauce. Oyster sauce. Cocktail sauce. Horseradish that you find on the shelf. Ketchup. Mustard. Meat flavorings and tenderizers. Bouillon cubes. Hot sauce and Tabasco sauce. Premade or packaged marinades. Premade or packaged taco seasonings. Relishes. Regular salad dressings. Where to find more information:  National Heart, Lung, and Blood Institute: www.nhlbi.nih.gov  American Heart Association: www.heart.org Summary  The DASH eating plan is a healthy eating plan that has been shown to reduce high blood pressure (hypertension). It may also reduce your risk for type 2 diabetes, heart disease, and stroke.  With the DASH eating plan, you should limit salt (sodium) intake to 2,300 mg a day. If you have hypertension, you may need to reduce your sodium intake to 1,500 mg a day.  When on the DASH eating plan, aim to eat more fresh fruits and vegetables, whole grains, lean proteins, low-fat dairy, and heart-healthy fats.  Work with your health care provider or diet and nutrition specialist (dietitian) to adjust your eating plan to your individual   calorie needs. This information is not intended to replace advice given to you by your health care provider. Make sure you discuss any questions you have with your health care provider. Document Released: 08/07/2011 Document Revised: 08/11/2016 Document Reviewed: 08/11/2016 Elsevier Interactive Patient Education  2017 Elsevier Inc.  

## 2016-12-23 NOTE — Progress Notes (Signed)
   Subjective:    Patient ID: Grace Rodriguez, female    DOB: Nov 02, 1970, 46 y.o.   MRN: [id number]  HPI  ISLEY WEISHEIT is here today for follow up of chronic medical problem.  Outpatient Encounter Prescriptions as of 12/23/2016  Medication Sig  . lisinopril (PRINIVIL,ZESTRIL) 20 MG tablet Take 1 Tablet by mouth once daily  . rosuvastatin (CRESTOR) 20 MG tablet Take 1 tablet (20 mg total) by mouth daily.   No facility-administered encounter medications on file as of 12/23/2016.     1. Essential hypertension  No c/o chest pain, SOB or headache  2. BMI 32.0-32.9,adult  No recent weight gain or weight loss  3. Hyperlipidemia with target LDL less than 100  Doe snot watch diet as she should    New complaints: None today     Review of Systems  Constitutional: Negative for diaphoresis.  Eyes: Negative for pain.  Respiratory: Negative for shortness of breath.   Cardiovascular: Negative for chest pain, palpitations and leg swelling.  Gastrointestinal: Negative for abdominal pain.  Endocrine: Negative for polydipsia.  Skin: Negative for rash.       Cyst on left ring finger  Neurological: Negative for dizziness, weakness and headaches.  Hematological: Does not bruise/bleed easily.       Objective:   Physical Exam  Constitutional: She is oriented to person, place, and time. She appears well-developed and well-nourished.  HENT:  Nose: Nose normal.  Mouth/Throat: Oropharynx is clear and moist.  Eyes: EOM are normal.  Neck: Trachea normal, normal range of motion and full passive range of motion without pain. Neck supple. No JVD present. Carotid bruit is not present. No thyromegaly present.  Cardiovascular: Normal rate, regular rhythm, normal heart sounds and intact distal pulses.  Exam reveals no gallop and no friction rub.   No murmur heard. Pulmonary/Chest: Effort normal and breath sounds normal.  Abdominal: Soft. Bowel sounds are normal. She exhibits no distension and no mass. There  is no tenderness.  Musculoskeletal: Normal range of motion.  Lymphadenopathy:    She has no cervical adenopathy.  Neurological: She is alert and oriented to person, place, and time. She has normal reflexes.  Skin: Skin is warm and dry.  1cm tender fluid filled cyst on left ring finger  Psychiatric: She has a normal mood and affect. Her behavior is normal. Judgment and thought content normal.     BP 132/84   Pulse 70   Temp (!) 96.8 F (36 C) (Oral)   Ht 5' (1.524 m)   Wt 146 lb (66.2 kg)   BMI 28.51 kg/m      Assessment & Plan:  1. Essential hypertension Low sodium diet - CMP14+EGFR - lisinopril (PRINIVIL,ZESTRIL) 20 MG tablet; Take 1 tablet (20 mg total) by mouth daily.  Dispense: 90 tablet; Refill: 1  2. BMI 28.0-28.9,adult Discussed diet and exercise for person with BMI >25 Will recheck weight in 3-6 months  3. Hyperlipidemia with target LDL less than 100 Low fat diet - Lipid panel - rosuvastatin (CRESTOR) 20 MG tablet; Take 1 tablet (20 mg total) by mouth daily.  Dispense: 30 tablet; Refill: 5  4. Finger lesion Do not pick at area - Ambulatory referral to Dermatology    Labs pending Health maintenance reviewed Diet and exercise encouraged Continue all meds Follow up  In 6 months   [place], FNP

## 2016-12-24 LAB — CMP14+EGFR
ALK PHOS: 102 IU/L (ref 39–117)
ALT: 23 IU/L (ref 0–32)
AST: 34 IU/L (ref 0–40)
Albumin/Globulin Ratio: 1.6 (ref 1.2–2.2)
Albumin: 4.4 g/dL (ref 3.5–5.5)
BUN/Creatinine Ratio: 15 (ref 9–23)
BUN: 9 mg/dL (ref 6–24)
Bilirubin Total: 0.3 mg/dL (ref 0.0–1.2)
CALCIUM: 9.2 mg/dL (ref 8.7–10.2)
CO2: 25 mmol/L (ref 18–29)
CREATININE: 0.61 mg/dL (ref 0.57–1.00)
Chloride: 100 mmol/L (ref 96–106)
GFR calc Af Amer: 124 mL/min/{1.73_m2} (ref >59)
GFR, EST NON AFRICAN AMERICAN: 108 mL/min/{1.73_m2} (ref >59)
GLUCOSE: 77 mg/dL (ref 65–99)
Globulin, Total: 2.8 g/dL (ref 1.5–4.5)
Potassium: 4.7 mmol/L (ref 3.5–5.2)
SODIUM: 142 mmol/L (ref 134–144)
Total Protein: 7.2 g/dL (ref 6.0–8.5)

## 2016-12-24 LAB — LIPID PANEL
CHOL/HDL RATIO: 2.6 ratio (ref 0.0–4.4)
CHOLESTEROL TOTAL: 138 mg/dL (ref 100–199)
HDL: 53 mg/dL (ref >39)
LDL CALC: 61 mg/dL (ref 0–99)
TRIGLYCERIDES: 118 mg/dL (ref 0–149)
VLDL Cholesterol Cal: 24 mg/dL (ref 5–40)

## 2017-01-20 DIAGNOSIS — M71349 Other bursal cyst, unspecified hand: Secondary | ICD-10-CM | POA: Diagnosis not present

## 2017-01-31 DIAGNOSIS — N926 Irregular menstruation, unspecified: Secondary | ICD-10-CM | POA: Diagnosis not present

## 2017-01-31 DIAGNOSIS — J40 Bronchitis, not specified as acute or chronic: Secondary | ICD-10-CM | POA: Diagnosis not present

## 2017-02-09 ENCOUNTER — Other Ambulatory Visit: Payer: Self-pay | Admitting: Nurse Practitioner

## 2017-02-09 DIAGNOSIS — I10 Essential (primary) hypertension: Secondary | ICD-10-CM

## 2017-03-06 ENCOUNTER — Telehealth: Payer: Self-pay

## 2017-03-06 DIAGNOSIS — Z1231 Encounter for screening mammogram for malignant neoplasm of breast: Secondary | ICD-10-CM

## 2017-03-06 NOTE — Telephone Encounter (Signed)
Please order a mammogram for me at [facility]

## 2017-03-10 ENCOUNTER — Telehealth: Payer: Self-pay | Admitting: Nurse Practitioner

## 2017-03-12 ENCOUNTER — Telehealth: Payer: Self-pay | Admitting: Nurse Practitioner

## 2017-03-12 NOTE — Telephone Encounter (Signed)
[facility] at 1pm If this time is not suitable, pt may call [phone] to reschedule

## 2017-03-13 NOTE — Telephone Encounter (Signed)
Pt aware of appointment date/time 

## 2017-03-19 ENCOUNTER — Ambulatory Visit (HOSPITAL_COMMUNITY)
Admission: RE | Admit: 2017-03-19 | Discharge: 2017-03-19 | Disposition: A | Discharge status: Home / Self Care | Payer: BLUE CROSS/BLUE SHIELD | Source: Ambulatory Visit | Attending: Nurse Practitioner | Admitting: Nurse Practitioner

## 2017-03-19 DIAGNOSIS — Z1231 Encounter for screening mammogram for malignant neoplasm of breast: Secondary | ICD-10-CM | POA: Diagnosis not present

## 2017-03-23 ENCOUNTER — Other Ambulatory Visit: Payer: Self-pay | Admitting: Nurse Practitioner

## 2017-03-23 DIAGNOSIS — R229 Localized swelling, mass and lump, unspecified: Principal | ICD-10-CM

## 2017-03-23 DIAGNOSIS — R928 Other abnormal and inconclusive findings on diagnostic imaging of breast: Secondary | ICD-10-CM

## 2017-03-23 DIAGNOSIS — IMO0002 Reserved for concepts with insufficient information to code with codable children: Secondary | ICD-10-CM

## 2017-04-07 ENCOUNTER — Ambulatory Visit (HOSPITAL_COMMUNITY)
Admission: RE | Admit: 2017-04-07 | Discharge: 2017-04-07 | Disposition: A | Discharge status: Home / Self Care | Payer: BLUE CROSS/BLUE SHIELD | Source: Ambulatory Visit | Attending: Nurse Practitioner | Admitting: Nurse Practitioner

## 2017-04-07 DIAGNOSIS — N6313 Unspecified lump in the right breast, lower outer quadrant: Secondary | ICD-10-CM | POA: Diagnosis not present

## 2017-04-07 DIAGNOSIS — N6489 Other specified disorders of breast: Secondary | ICD-10-CM | POA: Diagnosis not present

## 2017-04-07 DIAGNOSIS — IMO0002 Reserved for concepts with insufficient information to code with codable children: Secondary | ICD-10-CM

## 2017-04-07 DIAGNOSIS — R229 Localized swelling, mass and lump, unspecified: Secondary | ICD-10-CM

## 2017-04-07 DIAGNOSIS — R922 Inconclusive mammogram: Secondary | ICD-10-CM | POA: Diagnosis not present

## 2017-04-07 DIAGNOSIS — R928 Other abnormal and inconclusive findings on diagnostic imaging of breast: Secondary | ICD-10-CM

## 2017-05-22 ENCOUNTER — Other Ambulatory Visit: Payer: Self-pay | Admitting: Nurse Practitioner

## 2017-05-22 DIAGNOSIS — E785 Hyperlipidemia, unspecified: Secondary | ICD-10-CM

## 2017-06-25 ENCOUNTER — Encounter: Payer: Self-pay | Admitting: Nurse Practitioner

## 2017-06-25 ENCOUNTER — Ambulatory Visit (INDEPENDENT_AMBULATORY_CARE_PROVIDER_SITE_OTHER): Payer: BLUE CROSS/BLUE SHIELD | Admitting: Nurse Practitioner

## 2017-06-25 VITALS — BP 116/76 | HR 71 | Temp 97.5°F | Ht 60.0 in | Wt 144.0 lb

## 2017-06-25 DIAGNOSIS — I10 Essential (primary) hypertension: Secondary | ICD-10-CM

## 2017-06-25 DIAGNOSIS — Z6832 Body mass index (BMI) 32.0-32.9, adult: Secondary | ICD-10-CM | POA: Diagnosis not present

## 2017-06-25 DIAGNOSIS — E785 Hyperlipidemia, unspecified: Secondary | ICD-10-CM | POA: Diagnosis not present

## 2017-06-25 DIAGNOSIS — J302 Other seasonal allergic rhinitis: Secondary | ICD-10-CM | POA: Diagnosis not present

## 2017-06-25 MED ORDER — LISINOPRIL 20 MG PO TABS: 20.0000 mg | ORAL_TABLET | Freq: Every day | ORAL | 1 refills | Status: DC

## 2017-06-25 MED ORDER — ROSUVASTATIN CALCIUM 20 MG PO TABS
20.0000 mg | ORAL_TABLET | Freq: Every day | ORAL | 1 refills | Status: DC
Start: 2017-06-25 — End: 2017-12-24

## 2017-06-25 MED ORDER — FLUTICASONE PROPIONATE 50 MCG/ACT NA SUSP: 2.0000 | Freq: Every day | NASAL | 6 refills | Status: DC

## 2017-06-25 NOTE — Progress Notes (Signed)
   Subjective:    Patient ID: Grace RuedLynn M Hatcher, female    DOB: [birth year], 46 y.o.   MRN: [id number]  HPI Grace Rodriguez is here today for follow up of chronic medical problem.  Outpatient Encounter Prescriptions as of 06/25/2017  Medication Sig  . lisinopril (PRINIVIL,ZESTRIL) 20 MG tablet Take 1 Tablet by mouth once daily  . rosuvastatin (CRESTOR) 20 MG tablet Take 1 Tablet by mouth once daily  . [DISCONTINUED] lisinopril (PRINIVIL,ZESTRIL) 20 MG tablet Take 1 tablet (20 mg total) by mouth daily.   No facility-administered encounter medications on file as of 06/25/2017.     1. Essential hypertension  Blood pressure well-controlled with lisinopril.  Patient does not check blood pressure at home.  2. Hyperlipidemia with target LDL less than 100  Managed with rosuvastatin.  LFTs monitored regularly.  3. BMI 32.0-32.9,adult  No significant weight gain or loss.    New complaints: Patient c/o watery eyes and runny nose.  Nasal discharge is described as clear and lasting the last month.  Social history:    Review of Systems  Constitutional: Negative for activity change, appetite change and fatigue.  HENT: Positive for rhinorrhea (clear drainage x 1 month).   Eyes: Positive for discharge (clear d/c x 1 month).  Respiratory: Negative for cough and shortness of breath.   Cardiovascular: Negative for chest pain and palpitations.  Neurological: Negative for dizziness, light-headedness and headaches.  All other systems reviewed and are negative.      Objective:   Physical Exam  Constitutional: She is oriented to person, place, and time. She appears well-developed and well-nourished. No distress.  HENT:  Head: Normocephalic.  Right Ear: External ear normal.  Left Ear: External ear normal.  Mouth/Throat: Oropharynx is clear and moist.  Eyes: Pupils are equal, round, and reactive to light.  Neck: Normal range of motion. Neck supple. No thyromegaly present.  Cardiovascular: Normal rate,  regular rhythm, normal heart sounds and intact distal pulses.   No murmur heard. Pulmonary/Chest: Effort normal and breath sounds normal. No respiratory distress. She has no wheezes.  Abdominal: Soft. Bowel sounds are normal. She exhibits no distension. There is no tenderness.  Musculoskeletal: Normal range of motion. She exhibits no edema.  Neurological: She is alert and oriented to person, place, and time.  Skin: Skin is warm and dry.  Psychiatric: She has a normal mood and affect. Her behavior is normal.   BP 116/76   Pulse 71   Temp (!) 97.5 F (36.4 C) (Oral)   Ht 5' (1.524 m)   Wt 144 lb (65.3 kg)   BMI 28.12 kg/m      Assessment & Plan:

## 2017-06-25 NOTE — Patient Instructions (Signed)
DASH Eating Plan DASH stands for "Dietary Approaches to Stop Hypertension." The DASH eating plan is a healthy eating plan that has been shown to reduce high blood pressure (hypertension). It may also reduce your risk for type 2 diabetes, heart disease, and stroke. The DASH eating plan may also help with weight loss. What are tips for following this plan? General guidelines  Avoid eating more than 2,300 mg (milligrams) of salt (sodium) a day. If you have hypertension, you may need to reduce your sodium intake to 1,500 mg a day.  Limit alcohol intake to no more than 1 drink a day for nonpregnant women and 2 drinks a day for men. One drink equals 12 oz of beer, 5 oz of wine, or 1 oz of hard liquor.  Work with your health care provider to maintain a healthy body weight or to lose weight. Ask what an ideal weight is for you.  Get at least 30 minutes of exercise that causes your heart to beat faster (aerobic exercise) most days of the week. Activities may include walking, swimming, or biking.  Work with your health care provider or diet and nutrition specialist (dietitian) to adjust your eating plan to your individual calorie needs. Reading food labels  Check food labels for the amount of sodium per serving. Choose foods with less than 5 percent of the Daily Value of sodium. Generally, foods with less than 300 mg of sodium per serving fit into this eating plan.  To find whole grains, look for the word "whole" as the first word in the ingredient list. Shopping  Buy products labeled as "low-sodium" or "no salt added."  Buy fresh foods. Avoid canned foods and premade or frozen meals. Cooking  Avoid adding salt when cooking. Use salt-free seasonings or herbs instead of table salt or sea salt. Check with your health care provider or pharmacist before using salt substitutes.  Do not fry foods. Cook foods using healthy methods such as baking, boiling, grilling, and broiling instead.  Cook with  heart-healthy oils, such as olive, canola, soybean, or sunflower oil. Meal planning   Eat a balanced diet that includes: ? 5 or more servings of fruits and vegetables each day. At each meal, try to fill half of your plate with fruits and vegetables. ? Up to 6-8 servings of whole grains each day. ? Less than 6 oz of lean meat, poultry, or fish each day. A 3-oz serving of meat is about the same size as a deck of cards. One egg equals 1 oz. ? 2 servings of low-fat dairy each day. ? A serving of nuts, seeds, or beans 5 times each week. ? Heart-healthy fats. Healthy fats called Omega-3 fatty acids are found in foods such as flaxseeds and coldwater fish, like sardines, salmon, and mackerel.  Limit how much you eat of the following: ? Canned or prepackaged foods. ? Food that is high in trans fat, such as fried foods. ? Food that is high in saturated fat, such as fatty meat. ? Sweets, desserts, sugary drinks, and other foods with added sugar. ? Full-fat dairy products.  Do not salt foods before eating.  Try to eat at least 2 vegetarian meals each week.  Eat more home-cooked food and less restaurant, buffet, and fast food.  When eating at a restaurant, ask that your food be prepared with less salt or no salt, if possible. What foods are recommended? The items listed may not be a complete list. Talk with your dietitian about what   dietary choices are best for you. Grains Whole-grain or whole-wheat bread. Whole-grain or whole-wheat pasta. Brown rice. Oatmeal. Quinoa. Bulgur. Whole-grain and low-sodium cereals. Pita bread. Low-fat, low-sodium crackers. Whole-wheat flour tortillas. Vegetables Fresh or frozen vegetables (raw, steamed, roasted, or grilled). Low-sodium or reduced-sodium tomato and vegetable juice. Low-sodium or reduced-sodium tomato sauce and tomato paste. Low-sodium or reduced-sodium canned vegetables. Fruits All fresh, dried, or frozen fruit. Canned fruit in natural juice (without  added sugar). Meat and other protein foods Skinless chicken or turkey. Ground chicken or turkey. Pork with fat trimmed off. Fish and seafood. Egg whites. Dried beans, peas, or lentils. Unsalted nuts, nut butters, and seeds. Unsalted canned beans. Lean cuts of beef with fat trimmed off. Low-sodium, lean deli meat. Dairy Low-fat (1%) or fat-free (skim) milk. Fat-free, low-fat, or reduced-fat cheeses. Nonfat, low-sodium ricotta or cottage cheese. Low-fat or nonfat yogurt. Low-fat, low-sodium cheese. Fats and oils Soft margarine without trans fats. Vegetable oil. Low-fat, reduced-fat, or light mayonnaise and salad dressings (reduced-sodium). Canola, safflower, olive, soybean, and sunflower oils. Avocado. Seasoning and other foods Herbs. Spices. Seasoning mixes without salt. Unsalted popcorn and pretzels. Fat-free sweets. What foods are not recommended? The items listed may not be a complete list. Talk with your dietitian about what dietary choices are best for you. Grains Baked goods made with fat, such as croissants, muffins, or some breads. Dry pasta or rice meal packs. Vegetables Creamed or fried vegetables. Vegetables in a cheese sauce. Regular canned vegetables (not low-sodium or reduced-sodium). Regular canned tomato sauce and paste (not low-sodium or reduced-sodium). Regular tomato and vegetable juice (not low-sodium or reduced-sodium). Pickles. Olives. Fruits Canned fruit in a light or heavy syrup. Fried fruit. Fruit in cream or butter sauce. Meat and other protein foods Fatty cuts of meat. Ribs. Fried meat. Bacon. Sausage. Bologna and other processed lunch meats. Salami. Fatback. Hotdogs. Bratwurst. Salted nuts and seeds. Canned beans with added salt. Canned or smoked fish. Whole eggs or egg yolks. Chicken or turkey with skin. Dairy Whole or 2% milk, cream, and half-and-half. Whole or full-fat cream cheese. Whole-fat or sweetened yogurt. Full-fat cheese. Nondairy creamers. Whipped toppings.  Processed cheese and cheese spreads. Fats and oils Butter. Stick margarine. Lard. Shortening. Ghee. Bacon fat. Tropical oils, such as coconut, palm kernel, or palm oil. Seasoning and other foods Salted popcorn and pretzels. Onion salt, garlic salt, seasoned salt, table salt, and sea salt. Worcestershire sauce. Tartar sauce. Barbecue sauce. Teriyaki sauce. Soy sauce, including reduced-sodium. Steak sauce. Canned and packaged gravies. Fish sauce. Oyster sauce. Cocktail sauce. Horseradish that you find on the shelf. Ketchup. Mustard. Meat flavorings and tenderizers. Bouillon cubes. Hot sauce and Tabasco sauce. Premade or packaged marinades. Premade or packaged taco seasonings. Relishes. Regular salad dressings. Where to find more information:  National Heart, Lung, and Blood Institute: www.nhlbi.nih.gov  American Heart Association: www.heart.org Summary  The DASH eating plan is a healthy eating plan that has been shown to reduce high blood pressure (hypertension). It may also reduce your risk for type 2 diabetes, heart disease, and stroke.  With the DASH eating plan, you should limit salt (sodium) intake to 2,300 mg a day. If you have hypertension, you may need to reduce your sodium intake to 1,500 mg a day.  When on the DASH eating plan, aim to eat more fresh fruits and vegetables, whole grains, lean proteins, low-fat dairy, and heart-healthy fats.  Work with your health care provider or diet and nutrition specialist (dietitian) to adjust your eating plan to your individual   calorie needs. This information is not intended to replace advice given to you by your health care provider. Make sure you discuss any questions you have with your health care provider. Document Released: 08/07/2011 Document Revised: 08/11/2016 Document Reviewed: 08/11/2016 Elsevier Interactive Patient Education  2017 Elsevier Inc.  

## 2017-06-25 NOTE — Progress Notes (Signed)
Subjective:    Patient ID: Grace Rodriguez, female    DOB: 23-Mar-1971, 46 y.o.   MRN: [id number]  HPI  Grace Rodriguez is here today for follow up of chronic medical problem.  Outpatient Encounter Prescriptions as of 06/25/2017  Medication Sig  . lisinopril (PRINIVIL,ZESTRIL) 20 MG tablet Take 1 Tablet by mouth once daily  . rosuvastatin (CRESTOR) 20 MG tablet Take 1 Tablet by mouth once daily    1. Essential hypertension  No c/o chest pain,SOB or headache. Does not check blood pressure at home BP Readings from Last 3 Encounters:  12/23/16 132/84  06/20/16 (!) 144/8  04/07/16 118/76     2. Hyperlipidemia with target LDL less than 100  Tries to watch fat in diet  3. BMI 32.0-32.9,adult  No recent weight changes    New complaints: none  Social history:     Review of Systems  Constitutional: Negative for activity change and appetite change.  HENT: Negative.   Eyes: Negative for pain.  Respiratory: Negative for shortness of breath.   Cardiovascular: Negative for chest pain, palpitations and leg swelling.  Gastrointestinal: Negative for abdominal pain.  Endocrine: Negative for polydipsia.  Genitourinary: Negative.   Skin: Negative for rash.  Neurological: Negative for dizziness, weakness and headaches.  Hematological: Does not bruise/bleed easily.  Psychiatric/Behavioral: Negative.   All other systems reviewed and are negative.      Objective:   Physical Exam  Constitutional: She is oriented to person, place, and time. She appears well-developed and well-nourished.  HENT:  Nose: Nose normal.  Mouth/Throat: Oropharynx is clear and moist.  Eyes: EOM are normal.  Neck: Trachea normal, normal range of motion and full passive range of motion without pain. Neck supple. No JVD present. Carotid bruit is not present. No thyromegaly present.  Cardiovascular: Normal rate, regular rhythm, normal heart sounds and intact distal pulses.  Exam reveals no gallop and no friction rub.     No murmur heard. Pulmonary/Chest: Effort normal and breath sounds normal.  Abdominal: Soft. Bowel sounds are normal. She exhibits no distension and no mass. There is no tenderness.  Musculoskeletal: Normal range of motion.  Lymphadenopathy:    She has no cervical adenopathy.  Neurological: She is alert and oriented to person, place, and time. She has normal reflexes.  Skin: Skin is warm and dry.  Psychiatric: She has a normal mood and affect. Her behavior is normal. Judgment and thought content normal.   BP 116/76   Pulse 71   Temp (!) 97.5 F (36.4 C) (Oral)   Ht 5' (1.524 m)   Wt 144 lb (65.3 kg)   BMI 28.12 kg/m       Assessment & Plan:  1. Essential hypertension Low sodium diet - lisinopril (PRINIVIL,ZESTRIL) 20 MG tablet; Take 1 tablet (20 mg total) by mouth daily.  Dispense: 90 tablet; Refill: 1 - CMP14+EGFR  2. Hyperlipidemia with target LDL less than 100 Low fat diet - rosuvastatin (CRESTOR) 20 MG tablet; Take 1 tablet (20 mg total) by mouth daily.  Dispense: 90 tablet; Refill: 1 - Lipid panel  3. BMI 32.0-32.9,adult Discussed diet and exercise for person with BMI >25 Will recheck weight in 3-6 months  4. Seasonal allergies Force fluids - fluticasone (FLONASE) 50 MCG/ACT nasal spray; Place 2 sprays into both nostrils daily.  Dispense: 16 g; Refill: 6    Labs pending Health maintenance reviewed Diet and exercise encouraged Continue all meds Follow up  In 6 months   Mary-Margaret  Hassell Done, [place]

## 2017-06-26 LAB — CMP14+EGFR
ALK PHOS: 110 IU/L (ref 39–117)
ALT: 12 IU/L (ref 0–32)
AST: 13 IU/L (ref 0–40)
Albumin/Globulin Ratio: 1.5 (ref 1.2–2.2)
Albumin: 4.6 g/dL (ref 3.5–5.5)
BUN/Creatinine Ratio: 17 (ref 9–23)
BUN: 11 mg/dL (ref 6–24)
Bilirubin Total: 0.5 mg/dL (ref 0.0–1.2)
CALCIUM: 9.2 mg/dL (ref 8.7–10.2)
CO2: 21 mmol/L (ref 20–29)
CREATININE: 0.63 mg/dL (ref 0.57–1.00)
Chloride: 101 mmol/L (ref 96–106)
GFR calc Af Amer: 123 mL/min/{1.73_m2} (ref >59)
GFR, EST NON AFRICAN AMERICAN: 106 mL/min/{1.73_m2} (ref >59)
GLUCOSE: 78 mg/dL (ref 65–99)
Globulin, Total: 3 g/dL (ref 1.5–4.5)
Potassium: 4.2 mmol/L (ref 3.5–5.2)
Sodium: 145 mmol/L — ABNORMAL HIGH (ref 134–144)
Total Protein: 7.6 g/dL (ref 6.0–8.5)

## 2017-06-26 LAB — LIPID PANEL
CHOLESTEROL TOTAL: 110 mg/dL (ref 100–199)
Chol/HDL Ratio: 2 ratio (ref 0.0–4.4)
HDL: 55 mg/dL (ref >39)
LDL CALC: 45 mg/dL (ref 0–99)
TRIGLYCERIDES: 50 mg/dL (ref 0–149)
VLDL Cholesterol Cal: 10 mg/dL (ref 5–40)

## 2017-08-04 ENCOUNTER — Ambulatory Visit: Payer: BLUE CROSS/BLUE SHIELD | Admitting: Family Medicine

## 2017-08-04 ENCOUNTER — Encounter: Payer: Self-pay | Admitting: Family Medicine

## 2017-08-04 VITALS — BP 113/75 | HR 87 | Temp 97.1°F | Ht 60.0 in | Wt 150.0 lb

## 2017-08-04 DIAGNOSIS — N3090 Cystitis, unspecified without hematuria: Secondary | ICD-10-CM | POA: Diagnosis not present

## 2017-08-04 DIAGNOSIS — R399 Unspecified symptoms and signs involving the genitourinary system: Secondary | ICD-10-CM | POA: Diagnosis not present

## 2017-08-04 LAB — URINALYSIS
BILIRUBIN UA: NEGATIVE
GLUCOSE, UA: NEGATIVE
KETONES UA: NEGATIVE
Nitrite, UA: NEGATIVE
Specific Gravity, UA: 1.015 (ref 1.005–1.030)
UUROB: 0.2 mg/dL (ref 0.2–1.0)
pH, UA: 6.5 (ref 5.0–7.5)

## 2017-08-04 MED ORDER — CIPROFLOXACIN HCL 250 MG PO TABS: 250.0000 mg | ORAL_TABLET | Freq: Two times a day (BID) | ORAL | 0 refills | Status: DC

## 2017-08-04 NOTE — Progress Notes (Signed)
Chief Complaint  Patient presents with  . Dysuria    pt here today c/o dysuria with hematuria and lower abdominal pain    HPI  Patient presents today for burning with urination and frequency for several days. Denies fever . No flank pain. No nausea, vomiting.   PMH: Smoking status noted ROS: Per HPI  Objective: BP 113/75   Pulse 87   Temp (!) 97.1 F (36.2 C) (Oral)   Ht 5' (1.524 m)   Wt 150 lb (68 kg)   BMI 29.29 kg/m  Gen: NAD, alert, cooperative with exam HEENT: NCAT, EOMI, PERRL CV: RRR, good S1/S2, no murmur Resp: CTABL, no wheezes, non-labored Abd: SNTND, BS present, no guarding or organomegaly Ext: No edema, warm Neuro: Alert and oriented, No gross deficits  Assessment and plan:  1. Cystitis     Meds ordered this encounter  Medications  . ciprofloxacin (CIPRO) 250 MG tablet    Sig: Take 1 tablet (250 mg total) by mouth 2 (two) times daily.    Dispense:  10 tablet    Refill:  0    Orders Placed This Encounter  Procedures  . Urine Culture  . Urinalysis    Follow up as needed.  Mechele ClaudeWarren Demarie Uhlig, MD

## 2017-08-06 LAB — URINE CULTURE

## 2017-12-24 ENCOUNTER — Encounter: Payer: Self-pay | Admitting: Nurse Practitioner

## 2017-12-24 ENCOUNTER — Ambulatory Visit: Payer: BLUE CROSS/BLUE SHIELD | Admitting: Nurse Practitioner

## 2017-12-24 VITALS — BP 112/72 | HR 78 | Temp 97.6°F | Ht 60.0 in | Wt 158.0 lb

## 2017-12-24 DIAGNOSIS — Z6830 Body mass index (BMI) 30.0-30.9, adult: Secondary | ICD-10-CM | POA: Diagnosis not present

## 2017-12-24 DIAGNOSIS — I10 Essential (primary) hypertension: Secondary | ICD-10-CM | POA: Diagnosis not present

## 2017-12-24 DIAGNOSIS — E785 Hyperlipidemia, unspecified: Secondary | ICD-10-CM | POA: Diagnosis not present

## 2017-12-24 MED ORDER — LISINOPRIL 20 MG PO TABS: 20.0000 mg | ORAL_TABLET | Freq: Every day | ORAL | 1 refills | Status: DC

## 2017-12-24 MED ORDER — ROSUVASTATIN CALCIUM 20 MG PO TABS: 20.0000 mg | ORAL_TABLET | Freq: Every day | ORAL | 1 refills | Status: DC

## 2017-12-24 NOTE — Addendum Note (Signed)
Addended by: Cleda DaubUCKER, Bakari Nikolai G on: 12/24/2017 04:19 PM   Modules accepted: Orders

## 2017-12-24 NOTE — Progress Notes (Signed)
Subjective:    Patient ID: Grace RuedLynn M Kesling, female    DOB: 31-Dec-1970, 47 y.o.   MRN: [id number]  HPI Grace Rodriguez is here today for follow up of chronic medical problem.  Outpatient Encounter Medications as of 12/24/2017  Medication Sig  . fluticasone (FLONASE) 50 MCG/ACT nasal spray Place 2 sprays into both nostrils daily.  Marland Kitchen. lisinopril (PRINIVIL,ZESTRIL) 20 MG tablet Take 1 tablet (20 mg total) by mouth daily.  . rosuvastatin (CRESTOR) 20 MG tablet Take 1 tablet (20 mg total) by mouth daily.  . vitamin C (ASCORBIC ACID) 500 MG tablet Take 500 mg by mouth daily.    1. Essential hypertension  No c/o chest pain, sob or headache. Does not check blood pressure at home. BP Readings from Last 3 Encounters:  12/24/17 112/72  08/04/17 113/75  06/25/17 116/76     2. Hyperlipidemia with target LDL less than 100  Does not watch diet at all and does very little exercise.  3. 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  HENT: Negative.   Eyes: Negative for pain.  Respiratory: Negative for shortness of breath.   Cardiovascular: Negative for chest pain, palpitations and leg swelling.  Gastrointestinal: Negative for abdominal pain.  Endocrine: Negative for polydipsia.  Genitourinary: Negative.   Skin: Negative for rash.  Neurological: Negative for dizziness, weakness and headaches.  Hematological: Does not bruise/bleed easily.  Psychiatric/Behavioral: Negative.   All other systems reviewed and are negative.      Objective:   Physical Exam  Constitutional: She is oriented to person, place, and time. She appears well-developed and well-nourished.  HENT:  Nose: Nose normal.  Mouth/Throat: Oropharynx is clear and moist.  Eyes: EOM are normal.  Neck: Trachea normal, normal range of motion  and full passive range of motion without pain. Neck supple. No JVD present. Carotid bruit is not present. No thyromegaly present.  Cardiovascular: Normal rate, regular rhythm, normal heart sounds and intact distal pulses. Exam reveals no gallop and no friction rub.  No murmur heard. Pulmonary/Chest: Effort normal and breath sounds normal.  Abdominal: Soft. Bowel sounds are normal. She exhibits no distension and no mass. There is no tenderness.  Musculoskeletal: Normal range of motion.  Lymphadenopathy:    She has no cervical adenopathy.  Neurological: She is alert and oriented to person, place, and time. She has normal reflexes.  Skin: Skin is warm and dry.  Psychiatric: She has a normal mood and affect. Her behavior is normal. Judgment and thought content normal.   BP 112/72   Pulse 78   Temp 97.6 F (36.4 C) (Oral)   Ht 5' (1.524 m)   Wt 158 lb (71.7 kg)   BMI 30.86 kg/m        Assessment & Plan:  1. Essential hypertension Low sodium diet - lisinopril (PRINIVIL,ZESTRIL) 20 MG tablet; Take 1 tablet (20 mg total) by mouth daily.  Dispense: 90 tablet; Refill: 1  2. Hyperlipidemia with target LDL less than 100 Low fat diet - rosuvastatin (CRESTOR) 20 MG tablet; Take 1 tablet (20 mg total) by mouth daily.  Dispense: 90 tablet; Refill: 1  3. BMI 30.0-30.9,adult Discussed diet and exercise for person with BMI >25 Will recheck weight in 3-6 months     Labs pending Health maintenance reviewed Diet  and exercise encouraged Continue all meds Follow up  In 6 months   [place], FNP

## 2017-12-24 NOTE — Patient Instructions (Signed)
DASH Eating Plan DASH stands for "Dietary Approaches to Stop Hypertension." The DASH eating plan is a healthy eating plan that has been shown to reduce high blood pressure (hypertension). It may also reduce your risk for type 2 diabetes, heart disease, and stroke. The DASH eating plan may also help with weight loss. What are tips for following this plan? General guidelines  Avoid eating more than 2,300 mg (milligrams) of salt (sodium) a day. If you have hypertension, you may need to reduce your sodium intake to 1,500 mg a day.  Limit alcohol intake to no more than 1 drink a day for nonpregnant women and 2 drinks a day for men. One drink equals 12 oz of beer, 5 oz of wine, or 1 oz of hard liquor.  Work with your health care provider to maintain a healthy body weight or to lose weight. Ask what an ideal weight is for you.  Get at least 30 minutes of exercise that causes your heart to beat faster (aerobic exercise) most days of the week. Activities may include walking, swimming, or biking.  Work with your health care provider or diet and nutrition specialist (dietitian) to adjust your eating plan to your individual calorie needs. Reading food labels  Check food labels for the amount of sodium per serving. Choose foods with less than 5 percent of the Daily Value of sodium. Generally, foods with less than 300 mg of sodium per serving fit into this eating plan.  To find whole grains, look for the word "whole" as the first word in the ingredient list. Shopping  Buy products labeled as "low-sodium" or "no salt added."  Buy fresh foods. Avoid canned foods and premade or frozen meals. Cooking  Avoid adding salt when cooking. Use salt-free seasonings or herbs instead of table salt or sea salt. Check with your health care provider or pharmacist before using salt substitutes.  Do not fry foods. Cook foods using healthy methods such as baking, boiling, grilling, and broiling instead.  Cook with  heart-healthy oils, such as olive, canola, soybean, or sunflower oil. Meal planning   Eat a balanced diet that includes: ? 5 or more servings of fruits and vegetables each day. At each meal, try to fill half of your plate with fruits and vegetables. ? Up to 6-8 servings of whole grains each day. ? Less than 6 oz of lean meat, poultry, or fish each day. A 3-oz serving of meat is about the same size as a deck of cards. One egg equals 1 oz. ? 2 servings of low-fat dairy each day. ? A serving of nuts, seeds, or beans 5 times each week. ? Heart-healthy fats. Healthy fats called Omega-3 fatty acids are found in foods such as flaxseeds and coldwater fish, like sardines, salmon, and mackerel.  Limit how much you eat of the following: ? Canned or prepackaged foods. ? Food that is high in trans fat, such as fried foods. ? Food that is high in saturated fat, such as fatty meat. ? Sweets, desserts, sugary drinks, and other foods with added sugar. ? Full-fat dairy products.  Do not salt foods before eating.  Try to eat at least 2 vegetarian meals each week.  Eat more home-cooked food and less restaurant, buffet, and fast food.  When eating at a restaurant, ask that your food be prepared with less salt or no salt, if possible. What foods are recommended? The items listed may not be a complete list. Talk with your dietitian about what   dietary choices are best for you. Grains Whole-grain or whole-wheat bread. Whole-grain or whole-wheat pasta. Brown rice. Oatmeal. Quinoa. Bulgur. Whole-grain and low-sodium cereals. Pita bread. Low-fat, low-sodium crackers. Whole-wheat flour tortillas. Vegetables Fresh or frozen vegetables (raw, steamed, roasted, or grilled). Low-sodium or reduced-sodium tomato and vegetable juice. Low-sodium or reduced-sodium tomato sauce and tomato paste. Low-sodium or reduced-sodium canned vegetables. Fruits All fresh, dried, or frozen fruit. Canned fruit in natural juice (without  added sugar). Meat and other protein foods Skinless chicken or turkey. Ground chicken or turkey. Pork with fat trimmed off. Fish and seafood. Egg whites. Dried beans, peas, or lentils. Unsalted nuts, nut butters, and seeds. Unsalted canned beans. Lean cuts of beef with fat trimmed off. Low-sodium, lean deli meat. Dairy Low-fat (1%) or fat-free (skim) milk. Fat-free, low-fat, or reduced-fat cheeses. Nonfat, low-sodium ricotta or cottage cheese. Low-fat or nonfat yogurt. Low-fat, low-sodium cheese. Fats and oils Soft margarine without trans fats. Vegetable oil. Low-fat, reduced-fat, or light mayonnaise and salad dressings (reduced-sodium). Canola, safflower, olive, soybean, and sunflower oils. Avocado. Seasoning and other foods Herbs. Spices. Seasoning mixes without salt. Unsalted popcorn and pretzels. Fat-free sweets. What foods are not recommended? The items listed may not be a complete list. Talk with your dietitian about what dietary choices are best for you. Grains Baked goods made with fat, such as croissants, muffins, or some breads. Dry pasta or rice meal packs. Vegetables Creamed or fried vegetables. Vegetables in a cheese sauce. Regular canned vegetables (not low-sodium or reduced-sodium). Regular canned tomato sauce and paste (not low-sodium or reduced-sodium). Regular tomato and vegetable juice (not low-sodium or reduced-sodium). Pickles. Olives. Fruits Canned fruit in a light or heavy syrup. Fried fruit. Fruit in cream or butter sauce. Meat and other protein foods Fatty cuts of meat. Ribs. Fried meat. Bacon. Sausage. Bologna and other processed lunch meats. Salami. Fatback. Hotdogs. Bratwurst. Salted nuts and seeds. Canned beans with added salt. Canned or smoked fish. Whole eggs or egg yolks. Chicken or turkey with skin. Dairy Whole or 2% milk, cream, and half-and-half. Whole or full-fat cream cheese. Whole-fat or sweetened yogurt. Full-fat cheese. Nondairy creamers. Whipped toppings.  Processed cheese and cheese spreads. Fats and oils Butter. Stick margarine. Lard. Shortening. Ghee. Bacon fat. Tropical oils, such as coconut, palm kernel, or palm oil. Seasoning and other foods Salted popcorn and pretzels. Onion salt, garlic salt, seasoned salt, table salt, and sea salt. Worcestershire sauce. Tartar sauce. Barbecue sauce. Teriyaki sauce. Soy sauce, including reduced-sodium. Steak sauce. Canned and packaged gravies. Fish sauce. Oyster sauce. Cocktail sauce. Horseradish that you find on the shelf. Ketchup. Mustard. Meat flavorings and tenderizers. Bouillon cubes. Hot sauce and Tabasco sauce. Premade or packaged marinades. Premade or packaged taco seasonings. Relishes. Regular salad dressings. Where to find more information:  National Heart, Lung, and Blood Institute: www.nhlbi.nih.gov  American Heart Association: www.heart.org Summary  The DASH eating plan is a healthy eating plan that has been shown to reduce high blood pressure (hypertension). It may also reduce your risk for type 2 diabetes, heart disease, and stroke.  With the DASH eating plan, you should limit salt (sodium) intake to 2,300 mg a day. If you have hypertension, you may need to reduce your sodium intake to 1,500 mg a day.  When on the DASH eating plan, aim to eat more fresh fruits and vegetables, whole grains, lean proteins, low-fat dairy, and heart-healthy fats.  Work with your health care provider or diet and nutrition specialist (dietitian) to adjust your eating plan to your individual   calorie needs. This information is not intended to replace advice given to you by your health care provider. Make sure you discuss any questions you have with your health care provider. Document Released: 08/07/2011 Document Revised: 08/11/2016 Document Reviewed: 08/11/2016 Elsevier Interactive Patient Education  2018 Elsevier Inc.  

## 2017-12-25 LAB — LIPID PANEL
Chol/HDL Ratio: 2.3 ratio (ref 0.0–4.4)
Cholesterol, Total: 115 mg/dL (ref 100–199)
HDL: 51 mg/dL (ref >39)
LDL Calculated: 50 mg/dL (ref 0–99)
Triglycerides: 70 mg/dL (ref 0–149)
VLDL CHOLESTEROL CAL: 14 mg/dL (ref 5–40)

## 2017-12-25 LAB — CMP14+EGFR
A/G RATIO: 1.7 (ref 1.2–2.2)
ALBUMIN: 4.4 g/dL (ref 3.5–5.5)
ALT: 12 IU/L (ref 0–32)
AST: 14 IU/L (ref 0–40)
Alkaline Phosphatase: 106 IU/L (ref 39–117)
BILIRUBIN TOTAL: 0.3 mg/dL (ref 0.0–1.2)
BUN / CREAT RATIO: 16 (ref 9–23)
BUN: 11 mg/dL (ref 6–24)
CHLORIDE: 100 mmol/L (ref 96–106)
CO2: 25 mmol/L (ref 20–29)
Calcium: 9.4 mg/dL (ref 8.7–10.2)
Creatinine, Ser: 0.67 mg/dL (ref 0.57–1.00)
GFR calc Af Amer: 119 mL/min/{1.73_m2} (ref >59)
GFR calc non Af Amer: 104 mL/min/{1.73_m2} (ref >59)
Globulin, Total: 2.6 g/dL (ref 1.5–4.5)
Glucose: 85 mg/dL (ref 65–99)
POTASSIUM: 4.3 mmol/L (ref 3.5–5.2)
Sodium: 141 mmol/L (ref 134–144)
TOTAL PROTEIN: 7 g/dL (ref 6.0–8.5)

## 2018-02-05 ENCOUNTER — Encounter: Payer: Self-pay | Admitting: *Deleted

## 2018-02-05 ENCOUNTER — Other Ambulatory Visit: Payer: Self-pay | Admitting: Nurse Practitioner

## 2018-02-05 DIAGNOSIS — J302 Other seasonal allergic rhinitis: Secondary | ICD-10-CM

## 2018-06-24 ENCOUNTER — Ambulatory Visit: Payer: BLUE CROSS/BLUE SHIELD | Admitting: Nurse Practitioner

## 2018-06-25 ENCOUNTER — Ambulatory Visit: Payer: BLUE CROSS/BLUE SHIELD | Admitting: Nurse Practitioner

## 2018-06-25 ENCOUNTER — Encounter: Payer: Self-pay | Admitting: Nurse Practitioner

## 2018-06-25 VITALS — BP 120/75 | HR 78 | Temp 97.6°F | Ht 60.0 in | Wt 161.0 lb

## 2018-06-25 DIAGNOSIS — Z6830 Body mass index (BMI) 30.0-30.9, adult: Secondary | ICD-10-CM

## 2018-06-25 DIAGNOSIS — E785 Hyperlipidemia, unspecified: Secondary | ICD-10-CM

## 2018-06-25 DIAGNOSIS — I10 Essential (primary) hypertension: Secondary | ICD-10-CM

## 2018-06-25 MED ORDER — ROSUVASTATIN CALCIUM 20 MG PO TABS: 20.0000 mg | ORAL_TABLET | Freq: Every day | ORAL | 1 refills | Status: DC

## 2018-06-25 MED ORDER — LISINOPRIL 20 MG PO TABS: 20.0000 mg | ORAL_TABLET | Freq: Every day | ORAL | 1 refills | Status: DC

## 2018-06-25 NOTE — Patient Instructions (Signed)

## 2018-06-25 NOTE — Progress Notes (Signed)
Subjective:    Patient ID: Grace Rodriguez, female    DOB: October 28, 1970, 47 y.o.   MRN: [id number]   Chief Complaint: Medical management of chronic issues  HPI:  1. Essential hypertension  No c/o chest pain, sob or headache. Does not check blood pressure at home. BP Readings from Last 3 Encounters:  12/24/17 112/72  08/04/17 113/75  06/25/17 116/76     2. Hyperlipidemia with target LDL less than 100  Does not watch diet and does very little exercise  3. BMI 30.0-30.9,adult  No recent weight changes    Outpatient Encounter Medications as of 06/25/2018  Medication Sig  . fluticasone (FLONASE) 50 MCG/ACT nasal spray USE 2 SPRAYS IN EACH NOSTRIL ONCE DAILY  . lisinopril (PRINIVIL,ZESTRIL) 20 MG tablet Take 1 tablet (20 mg total) by mouth daily.  . rosuvastatin (CRESTOR) 20 MG tablet Take 1 tablet (20 mg total) by mouth daily.  . vitamin C (ASCORBIC ACID) 500 MG tablet Take 500 mg by mouth daily.       New complaints: No complaints  Social history: [employer]  Review of Systems  Constitutional: Negative for activity change and appetite change.  HENT: Negative.   Eyes: Negative for pain.  Respiratory: Negative for shortness of breath.   Cardiovascular: Negative for chest pain, palpitations and leg swelling.  Gastrointestinal: Negative for abdominal pain.  Endocrine: Negative for polydipsia.  Genitourinary: Negative.   Skin: Negative for rash.  Neurological: Negative for dizziness, weakness and headaches.  Hematological: Does not bruise/bleed easily.  Psychiatric/Behavioral: Negative.   All other systems reviewed and are negative.      Objective:   Physical Exam  Constitutional: She is oriented to person, place, and time. She appears well-developed and well-nourished. No distress.  HENT:  Head: Normocephalic.  Nose: Nose normal.  Mouth/Throat: Oropharynx is clear and moist.  Eyes: Pupils are equal, round, and reactive  to light. EOM are normal.  Neck: Normal range of motion. Neck supple. No JVD present. Carotid bruit is not present.  Cardiovascular: Normal rate, regular rhythm, normal heart sounds and intact distal pulses.  Pulmonary/Chest: Effort normal and breath sounds normal. No respiratory distress. She has no wheezes. She has no rales. She exhibits no tenderness.  Abdominal: Soft. Normal appearance, normal aorta and bowel sounds are normal. She exhibits no distension, no abdominal bruit, no pulsatile midline mass and no mass. There is no splenomegaly or hepatomegaly. There is no tenderness.  Musculoskeletal: Normal range of motion. She exhibits no edema.  Lymphadenopathy:    She has no cervical adenopathy.  Neurological: She is alert and oriented to person, place, and time. She has normal reflexes.  Skin: Skin is warm and dry.  Psychiatric: She has a normal mood and affect. Her behavior is normal. Judgment and thought content normal.  Nursing note and vitals reviewed.  BP 120/75   Pulse 78   Temp 97.6 F (36.4 C) (Oral)   Ht 5' (1.524 m)   Wt 161 lb (73 kg)   BMI 31.44 kg/m        Assessment & Plan:  Grace Rodriguez comes in today with chief complaint of Medical Management of Chronic Issues   Diagnosis and orders addressed:  1. Essential hypertension Low sodium diet - lisinopril (PRINIVIL,ZESTRIL) 20 MG tablet; Take 1 tablet (20 mg total) by mouth daily.  Dispense: 90 tablet; Refill: 1 - CMP14+EGFR  2. Hyperlipidemia with target LDL less than 100 Low fat diet -  rosuvastatin (CRESTOR) 20 MG tablet; Take 1 tablet (20 mg total) by mouth daily.  Dispense: 90 tablet; Refill: 1 - Lipid panel  3. BMI 30.0-30.9,adult Discussed diet and exercise for person with BMI >25 Will recheck weight in 3-6 months  * needs to schedule pap and mammogram  Labs pending Health Maintenance reviewed Diet and exercise encouraged  Follow up plan: 6 months   [place], FNP

## 2018-06-26 LAB — LIPID PANEL
CHOLESTEROL TOTAL: 104 mg/dL (ref 100–199)
Chol/HDL Ratio: 2.3 ratio (ref 0.0–4.4)
HDL: 45 mg/dL (ref >39)
LDL CALC: 49 mg/dL (ref 0–99)
Triglycerides: 52 mg/dL (ref 0–149)
VLDL CHOLESTEROL CAL: 10 mg/dL (ref 5–40)

## 2018-06-26 LAB — CMP14+EGFR
ALBUMIN: 4.4 g/dL (ref 3.5–5.5)
ALK PHOS: 102 IU/L (ref 39–117)
ALT: 7 IU/L (ref 0–32)
AST: 11 IU/L (ref 0–40)
Albumin/Globulin Ratio: 1.7 (ref 1.2–2.2)
BILIRUBIN TOTAL: 0.2 mg/dL (ref 0.0–1.2)
BUN / CREAT RATIO: 16 (ref 9–23)
BUN: 11 mg/dL (ref 6–24)
CO2: 27 mmol/L (ref 20–29)
CREATININE: 0.7 mg/dL (ref 0.57–1.00)
Calcium: 9.2 mg/dL (ref 8.7–10.2)
Chloride: 102 mmol/L (ref 96–106)
GFR calc non Af Amer: 102 mL/min/{1.73_m2} (ref >59)
GFR, EST AFRICAN AMERICAN: 118 mL/min/{1.73_m2} (ref >59)
GLOBULIN, TOTAL: 2.6 g/dL (ref 1.5–4.5)
Glucose: 97 mg/dL (ref 65–99)
Potassium: 3.9 mmol/L (ref 3.5–5.2)
SODIUM: 144 mmol/L (ref 134–144)
TOTAL PROTEIN: 7 g/dL (ref 6.0–8.5)

## 2018-07-16 ENCOUNTER — Telehealth: Payer: Self-pay | Admitting: Nurse Practitioner

## 2018-07-16 NOTE — Telephone Encounter (Signed)
What type of referral do you need? [facility]. Patient called to make appt but she has to have a referral. Suppose to go every six months  Have you been seen at our office for this problem? NO (If no, schedule them an appointment.  They will need to be seen before a referral can be done.)  Is there a particular doctor or location that you prefer? [facility]  Patient notified that referrals can take up to a week or longer to process. If they haven't heard anything within a week they should call back and speak with the referral department.

## 2018-07-19 ENCOUNTER — Other Ambulatory Visit: Payer: Self-pay | Admitting: Nurse Practitioner

## 2018-07-19 DIAGNOSIS — N6310 Unspecified lump in the right breast, unspecified quadrant: Secondary | ICD-10-CM

## 2018-07-20 ENCOUNTER — Ambulatory Visit (HOSPITAL_COMMUNITY): Payer: BLUE CROSS/BLUE SHIELD

## 2018-07-20 ENCOUNTER — Encounter (HOSPITAL_COMMUNITY): Payer: BLUE CROSS/BLUE SHIELD

## 2018-07-22 NOTE — Telephone Encounter (Signed)
Pt scheduled  

## 2018-07-27 ENCOUNTER — Ambulatory Visit (HOSPITAL_COMMUNITY)
Admission: RE | Admit: 2018-07-27 | Discharge: 2018-07-27 | Disposition: A | Discharge status: Home / Self Care | Payer: BLUE CROSS/BLUE SHIELD | Source: Ambulatory Visit | Attending: Nurse Practitioner | Admitting: Nurse Practitioner

## 2018-07-27 ENCOUNTER — Ambulatory Visit (HOSPITAL_COMMUNITY): Payer: BLUE CROSS/BLUE SHIELD

## 2018-07-27 DIAGNOSIS — N6310 Unspecified lump in the right breast, unspecified quadrant: Secondary | ICD-10-CM | POA: Insufficient documentation

## 2018-07-27 DIAGNOSIS — R922 Inconclusive mammogram: Secondary | ICD-10-CM | POA: Diagnosis not present

## 2018-07-27 DIAGNOSIS — N6313 Unspecified lump in the right breast, lower outer quadrant: Secondary | ICD-10-CM | POA: Diagnosis not present

## 2018-10-30 DIAGNOSIS — Z6832 Body mass index (BMI) 32.0-32.9, adult: Secondary | ICD-10-CM | POA: Diagnosis not present

## 2018-10-30 DIAGNOSIS — R05 Cough: Secondary | ICD-10-CM | POA: Diagnosis not present

## 2018-10-30 DIAGNOSIS — J069 Acute upper respiratory infection, unspecified: Secondary | ICD-10-CM | POA: Diagnosis not present

## 2018-10-30 DIAGNOSIS — R509 Fever, unspecified: Secondary | ICD-10-CM | POA: Diagnosis not present

## 2018-11-03 ENCOUNTER — Encounter: Payer: Self-pay | Admitting: Physician Assistant

## 2018-11-03 ENCOUNTER — Ambulatory Visit: Payer: BLUE CROSS/BLUE SHIELD | Admitting: Physician Assistant

## 2018-11-03 VITALS — BP 108/71 | HR 87 | Temp 98.9°F | Ht 60.0 in | Wt 167.8 lb

## 2018-11-03 DIAGNOSIS — J209 Acute bronchitis, unspecified: Secondary | ICD-10-CM

## 2018-11-03 MED ORDER — HYDROCODONE-HOMATROPINE 5-1.5 MG/5ML PO SYRP: 5.0000 mL | ORAL_SOLUTION | Freq: Four times a day (QID) | ORAL | 0 refills | Status: DC | PRN

## 2018-11-03 MED ORDER — PREDNISONE 10 MG (21) PO TBPK
ORAL_TABLET | ORAL | 0 refills | Status: DC
Start: 2018-11-03 — End: 2018-12-28

## 2018-11-03 MED ORDER — DOXYCYCLINE HYCLATE 100 MG PO TABS: 100.0000 mg | ORAL_TABLET | Freq: Two times a day (BID) | ORAL | 0 refills | Status: DC

## 2018-11-03 MED ORDER — ALBUTEROL SULFATE HFA 108 (90 BASE) MCG/ACT IN AERS: 2.0000 | INHALATION_SPRAY | Freq: Four times a day (QID) | RESPIRATORY_TRACT | 0 refills | Status: DC | PRN

## 2018-11-03 NOTE — Progress Notes (Signed)
BP 108/71   Pulse 87   Temp 98.9 F (37.2 C) (Oral)   Ht 5' (1.524 m)   Wt 167 lb 12.8 oz (76.1 kg)   BMI 32.77 kg/m    Subjective:    Patient ID: Grace Rodriguez, female    DOB: 04-Apr-1971, 48 y.o.   MRN: [id number]  HPI: Grace Rodriguez is a 48 y.o. female presenting on 11/03/2018 for Cough  Patient with several days of progressing upper respiratory and bronchial symptoms. Initially there was more upper respiratory congestion. This progressed to having significant cough that is productive throughout the day and severe at night. There is occasional wheezing after coughing. Sometimes there is slight dyspnea on exertion. It is productive mucus that is yellow in color. Denies any blood.  She had been seen by urgent care few days ago and did get a shot of Depo-Medrol and had a nebulizer treatment there.  She does remember the albuterol helping.  Past Medical History:  Diagnosis Date  . Hypertension    Relevant past medical, surgical, family and social history reviewed and updated as indicated. Interim medical history since our last visit reviewed. Allergies and medications reviewed and updated. DATA REVIEWED: CHART IN EPIC  Family History reviewed for pertinent findings.  Review of Systems  Constitutional: Positive for chills and fatigue. Negative for activity change, appetite change and fever.  HENT: Positive for congestion, postnasal drip and sore throat.   Eyes: Negative.   Respiratory: Positive for cough. Negative for wheezing.   Cardiovascular: Negative.  Negative for chest pain, palpitations and leg swelling.  Gastrointestinal: Negative.   Genitourinary: Negative.   Musculoskeletal: Negative.   Skin: Negative.   Neurological: Positive for headaches.    Allergies as of 11/03/2018      Reactions   Sulfa Antibiotics Rash      Medication List       Accurate as of November 03, 2018 12:08 PM. Always use your most recent med list.        albuterol 108 (90 Base) MCG/ACT  inhaler Commonly known as:  PROVENTIL HFA;VENTOLIN HFA Inhale 2 puffs into the lungs every 6 (six) hours as needed for wheezing or shortness of breath.   azithromycin 250 MG tablet Commonly known as:  ZITHROMAX   benzonatate 100 MG capsule Commonly known as:  TESSALON   doxycycline 100 MG tablet Commonly known as:  VIBRA-TABS Take 1 tablet (100 mg total) by mouth 2 (two) times daily. 1 po bid   fluticasone 50 MCG/ACT nasal spray Commonly known as:  FLONASE USE 2 SPRAYS IN EACH NOSTRIL ONCE DAILY   HYDROcodone-homatropine 5-1.5 MG/5ML syrup Commonly known as:  HYCODAN Take 5 mLs by mouth every 6 (six) hours as needed.   lisinopril 20 MG tablet Commonly known as:  PRINIVIL,ZESTRIL Take 1 tablet (20 mg total) by mouth daily.   predniSONE 10 MG (21) Tbpk tablet Commonly known as:  STERAPRED UNI-PAK 21 TAB As directed x 6 days   rosuvastatin 20 MG tablet Commonly known as:  CRESTOR Take 1 tablet (20 mg total) by mouth daily.   vitamin C 500 MG tablet Commonly known as:  ASCORBIC ACID Take 500 mg by mouth daily.          Objective:    BP 108/71   Pulse 87   Temp 98.9 F (37.2 C) (Oral)   Ht 5' (1.524 m)   Wt 167 lb 12.8 oz (76.1 kg)   BMI 32.77 kg/m   Allergies  Allergen  Reactions  . Sulfa Antibiotics Rash    Wt Readings from Last 3 Encounters:  11/03/18 167 lb 12.8 oz (76.1 kg)  06/25/18 161 lb (73 kg)  12/24/17 158 lb (71.7 kg)    Physical Exam Constitutional:      Appearance: She is well-developed.  HENT:     Head: Normocephalic and atraumatic.     Right Ear: Drainage and tenderness present.     Left Ear: Drainage and tenderness present.     Nose: Mucosal edema and rhinorrhea present.     Right Sinus: No maxillary sinus tenderness or frontal sinus tenderness.     Left Sinus: No maxillary sinus tenderness or frontal sinus tenderness.     Mouth/Throat:     Pharynx: Oropharyngeal exudate and posterior oropharyngeal erythema present.  Eyes:      Conjunctiva/sclera: Conjunctivae normal.     Pupils: Pupils are equal, round, and reactive to light.  Neck:     Musculoskeletal: Normal range of motion and neck supple.  Cardiovascular:     Rate and Rhythm: Normal rate and regular rhythm.     Heart sounds: Normal heart sounds.  Pulmonary:     Effort: Pulmonary effort is normal.     Breath sounds: Examination of the right-upper field reveals wheezing. Examination of the left-upper field reveals wheezing. Wheezing present.    Abdominal:     General: Bowel sounds are normal.     Palpations: Abdomen is soft.  Skin:    General: Skin is warm and dry.     Findings: No rash.  Neurological:     Mental Status: She is alert and oriented to person, place, and time.     Deep Tendon Reflexes: Reflexes are normal and symmetric.  Psychiatric:        Behavior: Behavior normal.        Thought Content: Thought content normal.        Judgment: Judgment normal.     Results for orders placed or performed in visit on 06/25/18  CMP14+EGFR  Result Value Ref Range   Glucose 97 65 - 99 mg/dL   BUN 11 6 - 24 mg/dL   Creatinine, Ser 0.70 0.57 - 1.00 mg/dL   GFR calc non Af Amer 102 >59 mL/min/1.73   GFR calc Af Amer 118 >59 mL/min/1.73   BUN/Creatinine Ratio 16 9 - 23   Sodium 144 134 - 144 mmol/L   Potassium 3.9 3.5 - 5.2 mmol/L   Chloride 102 96 - 106 mmol/L   CO2 27 20 - 29 mmol/L   Calcium 9.2 8.7 - 10.2 mg/dL   Total Protein 7.0 6.0 - 8.5 g/dL   Albumin 4.4 3.5 - 5.5 g/dL   Globulin, Total 2.6 1.5 - 4.5 g/dL   Albumin/Globulin Ratio 1.7 1.2 - 2.2   Bilirubin Total 0.2 0.0 - 1.2 mg/dL   Alkaline Phosphatase 102 39 - 117 IU/L   AST 11 0 - 40 IU/L   ALT 7 0 - 32 IU/L  Lipid panel  Result Value Ref Range   Cholesterol, Total 104 100 - 199 mg/dL   Triglycerides 52 0 - 149 mg/dL   HDL 45 >39 mg/dL   VLDL Cholesterol Cal 10 5 - 40 mg/dL   LDL Calculated 49 0 - 99 mg/dL   Chol/HDL Ratio 2.3 0.0 - 4.4 ratio      Assessment & Plan:   1.  Bronchitis, acute, with bronchospasm - benzonatate (TESSALON) 100 MG capsule - doxycycline (VIBRA-TABS) 100 MG tablet; Take  1 tablet (100 mg total) by mouth 2 (two) times daily. 1 po bid  Dispense: 20 tablet; Refill: 0 - albuterol (PROVENTIL HFA;VENTOLIN HFA) 108 (90 Base) MCG/ACT inhaler; Inhale 2 puffs into the lungs every 6 (six) hours as needed for wheezing or shortness of breath.  Dispense: 1 Inhaler; Refill: 0 - HYDROcodone-homatropine (HYCODAN) 5-1.5 MG/5ML syrup; Take 5 mLs by mouth every 6 (six) hours as needed.  Dispense: 120 mL; Refill: 0 - predniSONE (STERAPRED UNI-PAK 21 TAB) 10 MG (21) TBPK tablet; As directed x 6 days  Dispense: 21 tablet; Refill: 0   Continue all other maintenance medications as listed above.  Follow up plan: No follow-ups on file.  Educational handout given for  [place] PA-C [facility] [address]  [place], [place] [address] [phone]   11/03/2018, 12:08 PM

## 2018-11-21 ENCOUNTER — Other Ambulatory Visit: Payer: Self-pay | Admitting: Physician Assistant

## 2018-11-21 DIAGNOSIS — J209 Acute bronchitis, unspecified: Secondary | ICD-10-CM

## 2018-12-24 ENCOUNTER — Ambulatory Visit: Payer: BLUE CROSS/BLUE SHIELD | Admitting: Nurse Practitioner

## 2018-12-28 ENCOUNTER — Other Ambulatory Visit: Payer: Self-pay

## 2018-12-28 ENCOUNTER — Ambulatory Visit (INDEPENDENT_AMBULATORY_CARE_PROVIDER_SITE_OTHER): Payer: BLUE CROSS/BLUE SHIELD | Admitting: Nurse Practitioner

## 2018-12-28 ENCOUNTER — Encounter: Payer: Self-pay | Admitting: Nurse Practitioner

## 2018-12-28 DIAGNOSIS — E785 Hyperlipidemia, unspecified: Secondary | ICD-10-CM | POA: Diagnosis not present

## 2018-12-28 DIAGNOSIS — Z6830 Body mass index (BMI) 30.0-30.9, adult: Secondary | ICD-10-CM | POA: Diagnosis not present

## 2018-12-28 DIAGNOSIS — I10 Essential (primary) hypertension: Secondary | ICD-10-CM

## 2018-12-28 DIAGNOSIS — J209 Acute bronchitis, unspecified: Secondary | ICD-10-CM | POA: Diagnosis not present

## 2018-12-28 MED ORDER — LISINOPRIL 20 MG PO TABS: 20.0000 mg | ORAL_TABLET | Freq: Every day | ORAL | 1 refills | Status: DC

## 2018-12-28 MED ORDER — ROSUVASTATIN CALCIUM 20 MG PO TABS: 20.0000 mg | ORAL_TABLET | Freq: Every day | ORAL | 1 refills | Status: DC

## 2018-12-28 NOTE — Progress Notes (Signed)
Patient ID: Grace Rodriguez, female   DOB: 06/18/1971, 48 y.o.   MRN: [id number]    Virtual Visit via telephone Note  I connected with Grace Rodriguez on 12/28/18 at 3:20 PM by telephone and verified that I am speaking with the correct person using two identifiers. Grace Rodriguez is currently located at husbands truck and her husband is currently with her during visit. The provider, Mary-Margaret Daphine Deutscher, FNP is located in their office at time of visit.  I discussed the limitations, risks, security and privacy concerns of performing an evaluation and management service by telephone and the availability of in person appointments. I also discussed with the patient that there may be a patient responsible charge related to this service. The patient expressed understanding and agreed to proceed.   History and Present Illness:   Chief Complaint: medical management of chronic issues   HPI:  1. Essential hypertension No c/o chest pain, sob or headache. Does not check blood pressure at home. BP Readings from Last 3 Encounters:  11/03/18 108/71  06/25/18 120/75  12/24/17 112/72     2. Hyperlipidemia with target LDL less than 100 Does not watch diet and does very little exercise  3. BMI 30.0-30.9,adult No recent weight changes    Outpatient Encounter Medications as of 12/28/2018  Medication Sig  . azithromycin (ZITHROMAX) 250 MG tablet   . benzonatate (TESSALON) 100 MG capsule   . doxycycline (VIBRA-TABS) 100 MG tablet Take 1 tablet (100 mg total) by mouth 2 (two) times daily. 1 po bid  . fluticasone (FLONASE) 50 MCG/ACT nasal spray USE 2 SPRAYS IN EACH NOSTRIL ONCE DAILY  . HYDROcodone-homatropine (HYCODAN) 5-1.5 MG/5ML syrup Take 5 mLs by mouth every 6 (six) hours as needed.  Marland Kitchen lisinopril (PRINIVIL,ZESTRIL) 20 MG tablet Take 1 tablet (20 mg total) by mouth daily.  . predniSONE (STERAPRED UNI-PAK 21 TAB) 10 MG (21) TBPK tablet As directed x 6 days  . rosuvastatin (CRESTOR) 20 MG tablet Take 1  tablet (20 mg total) by mouth daily.  . VENTOLIN HFA 108 (90 Base) MCG/ACT inhaler TAKE 2 PUFFS BY MOUTH EVERY 6 HOURS AS NEEDED FOR WHEEZE OR SHORTNESS OF BREATH  . vitamin C (ASCORBIC ACID) 500 MG tablet Take 500 mg by mouth daily.     New complaints: None today  Social history: Her husband is a Naval architect and she is going on road trips with him. She is currently on their to [facility].   Review of Systems  Constitutional: Negative for diaphoresis and weight loss.  Eyes: Negative for blurred vision, double vision and pain.  Respiratory: Negative for shortness of breath.   Cardiovascular: Negative for chest pain, palpitations, orthopnea and leg swelling.  Gastrointestinal: Negative for abdominal pain.  Skin: Negative for rash.  Neurological: Negative for dizziness, sensory change, loss of consciousness, weakness and headaches.  Endo/Heme/Allergies: Negative for polydipsia. Does not bruise/bleed easily.  Psychiatric/Behavioral: Negative for memory loss. The patient does not have insomnia.   All other systems reviewed and are negative.    Observations/Objective: Alert and oriented- answers all questions appropriately No distress  Assessment and Plan: Grace Rodriguez comes in today with chief complaint of No chief complaint on file.   Diagnosis and orders addressed:  1. Essential hypertension Low sodium diet - lisinopril (ZESTRIL) 20 MG tablet; Take 1 tablet (20 mg total) by mouth daily.  Dispense: 90 tablet; Refill: 1  2. Hyperlipidemia with target LDL less than 100 Low fat diet - rosuvastatin (CRESTOR) 20  MG tablet; Take 1 tablet (20 mg total) by mouth daily.  Dispense: 90 tablet; Refill: 1  3. BMI 30.0-30.9,adult Discussed diet and exercise for person with BMI >25 Will recheck weight in 3-6 months   Previous labs results reviewed Health Maintenance reviewed Diet and exercise encouraged  Follow up plan: 3 months     I discussed the assessment and treatment plan  with the patient. The patient was provided an opportunity to ask questions and all were answered. The patient agreed with the plan and demonstrated an understanding of the instructions.   The patient was advised to call back or seek an in-person evaluation if the symptoms worsen or if the condition fails to improve as anticipated.  The above assessment and management plan was discussed with the patient. The patient verbalized understanding of and has agreed to the management plan. Patient is aware to call the clinic if symptoms persist or worsen. Patient is aware when to return to the clinic for a follow-up visit. Patient educated on when it is appropriate to go to the emergency department.    I provided 12 minutes of non-face-to-face time during this encounter.    Mary-Margaret Daphine DeutscherMartin, FNP

## 2019-03-07 ENCOUNTER — Ambulatory Visit: Payer: BLUE CROSS/BLUE SHIELD | Admitting: Nurse Practitioner

## 2019-03-09 IMAGING — MG DIGITAL DIAGNOSTIC BILATERAL MAMMOGRAM WITH TOMO AND CAD
6 of 10 series · 6 of 30 positions shown · non-contrast
Comparison: Previous exam(s).

CLINICAL DATA: Patient had a probably benign mass found right
breast, evaluated with diagnostic imaging on 04/07/2017.She was to
have short-term follow-up. This is her first follow-up since that
exam.

EXAM:
DIGITAL DIAGNOSTIC BILATERAL MAMMOGRAM WITH CAD AND TOMO
ULTRASOUND RIGHT BREAST

[L CC synth-2D (1 of 2)]
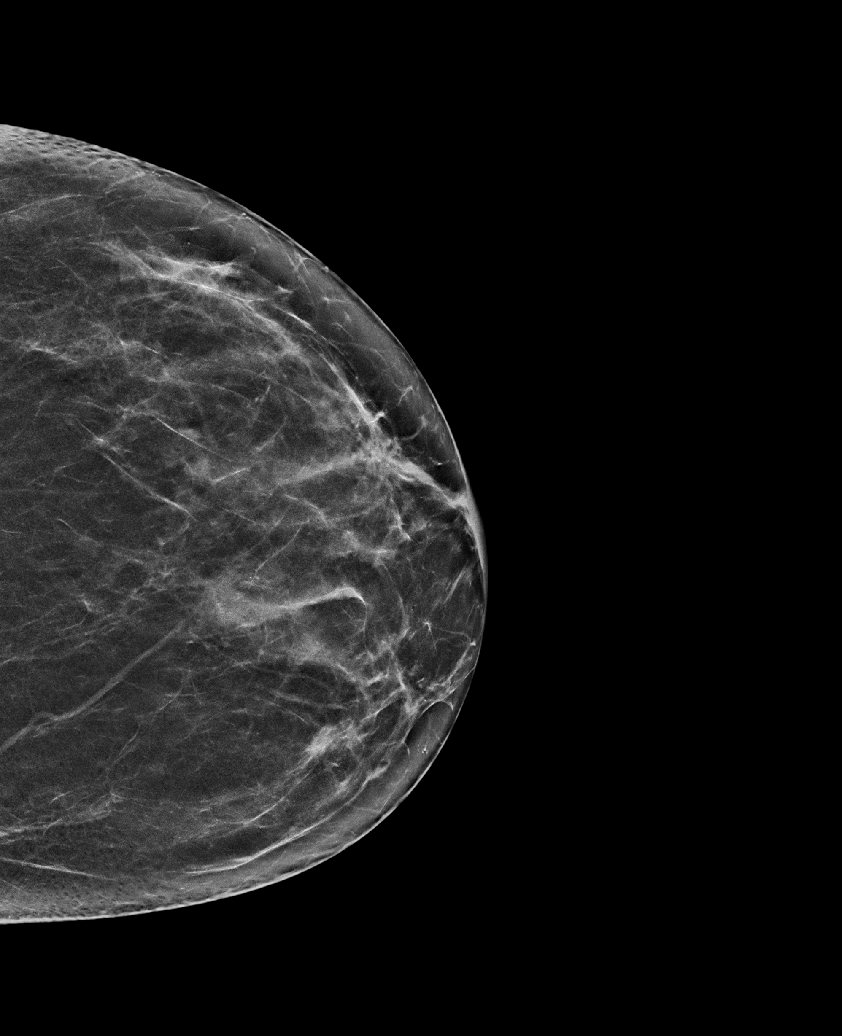

[R CC synth-2D]
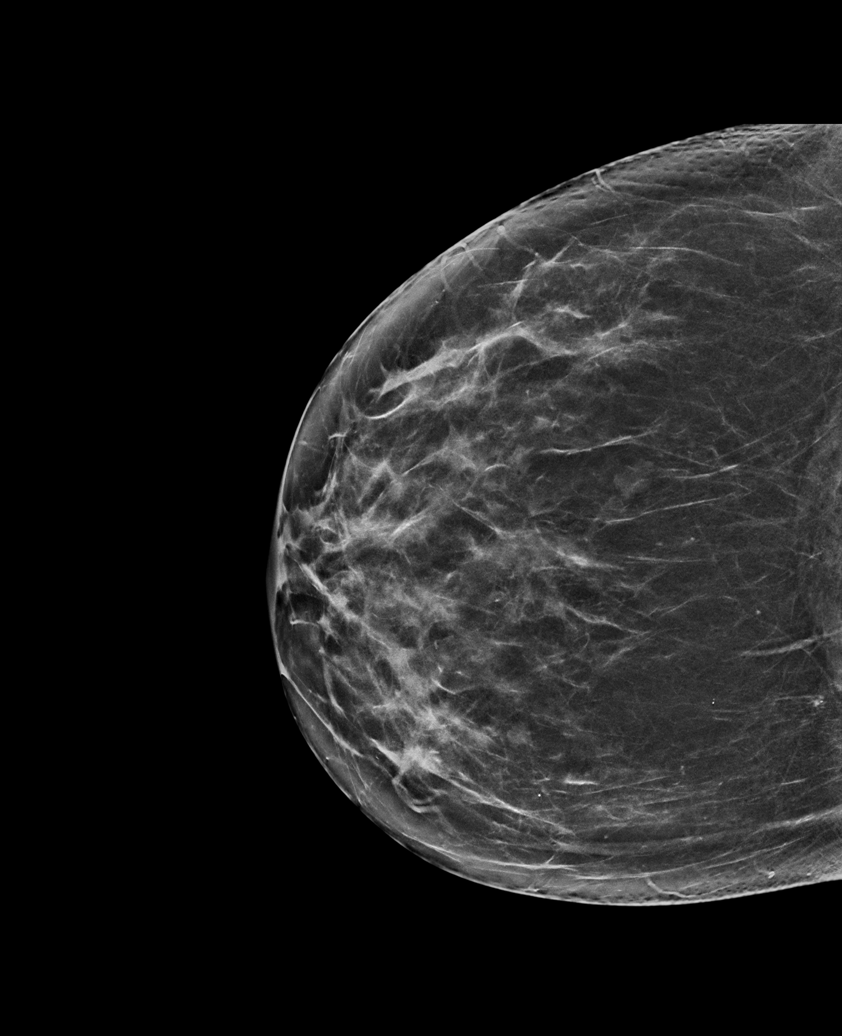

[R MLO synth-2D]
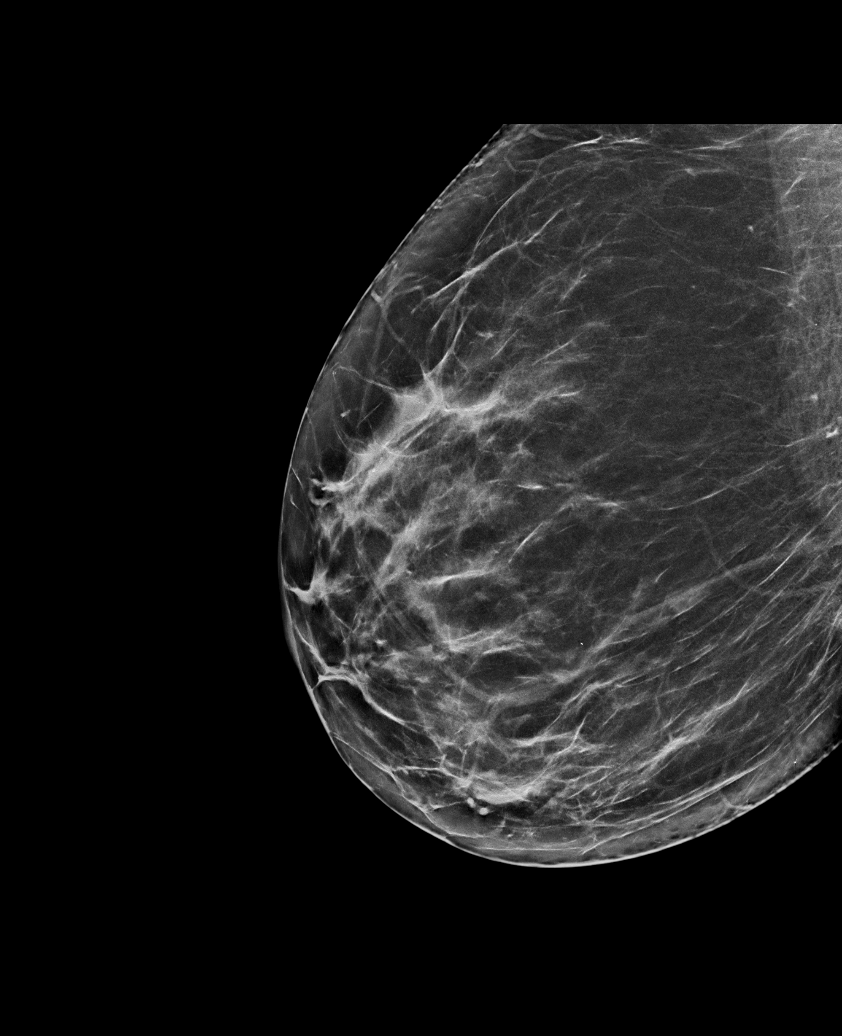

[L CC synth-2D (2 of 2)]
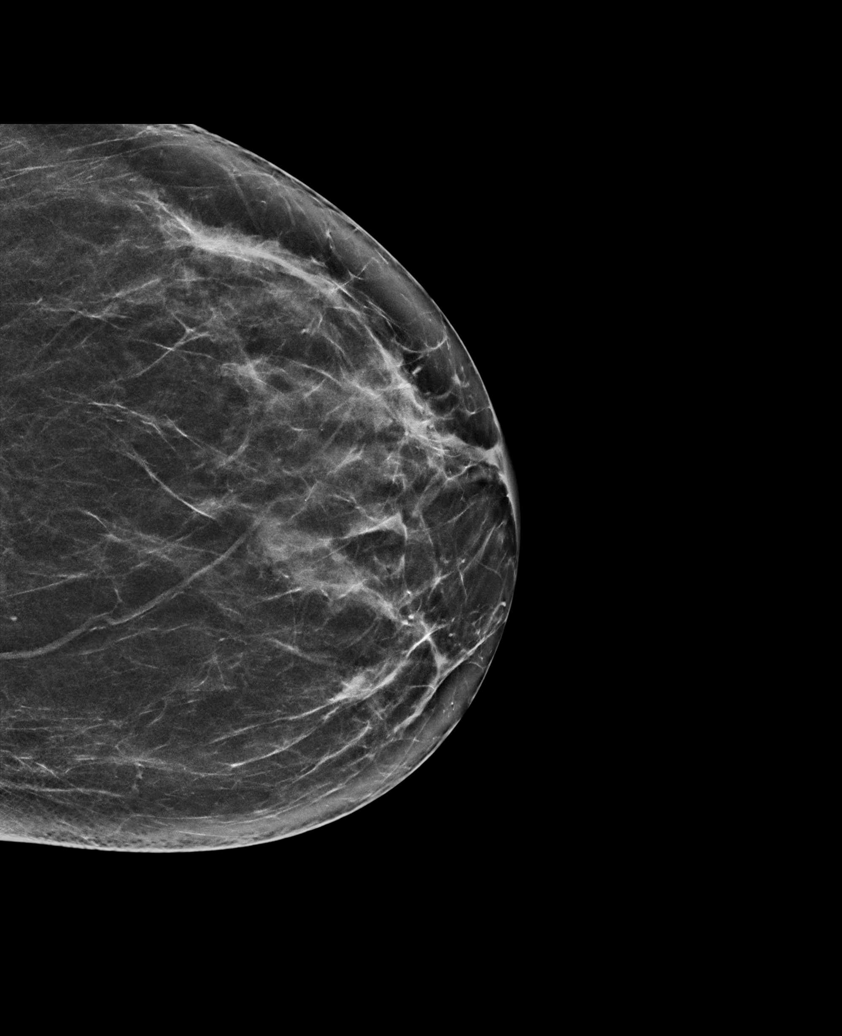

[L MLO synth-2D]
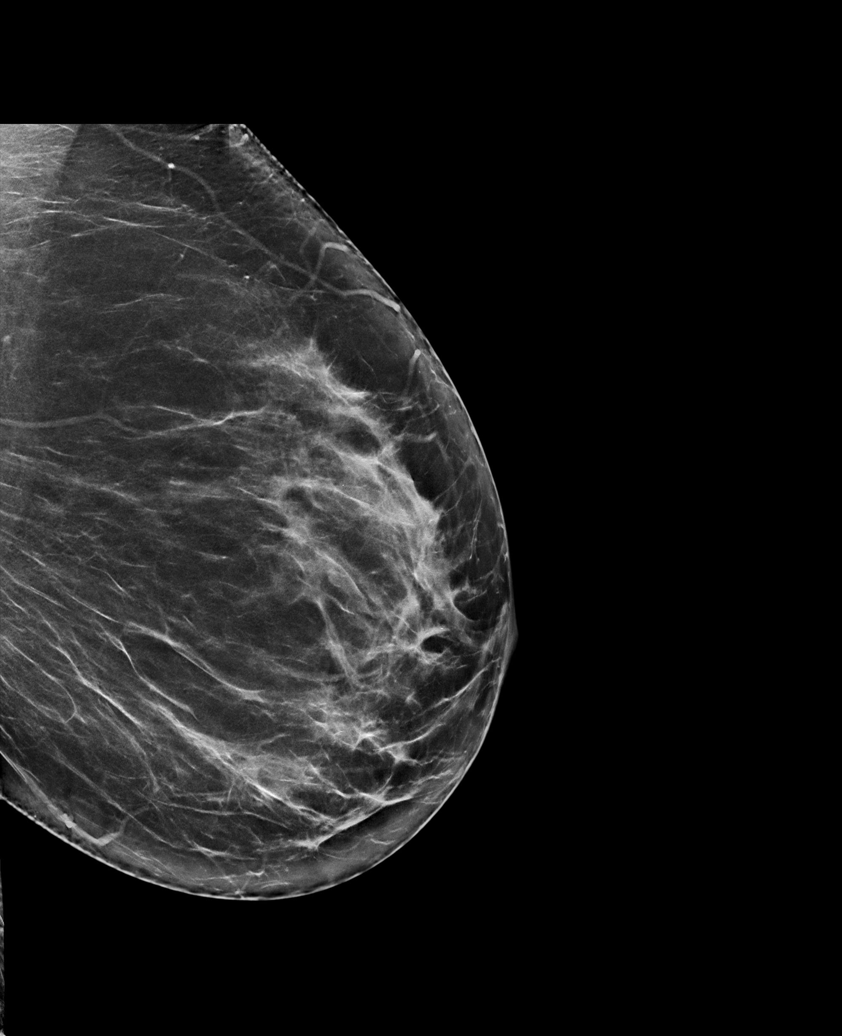

[R MLO tomo · tomo slice 37/74.0]
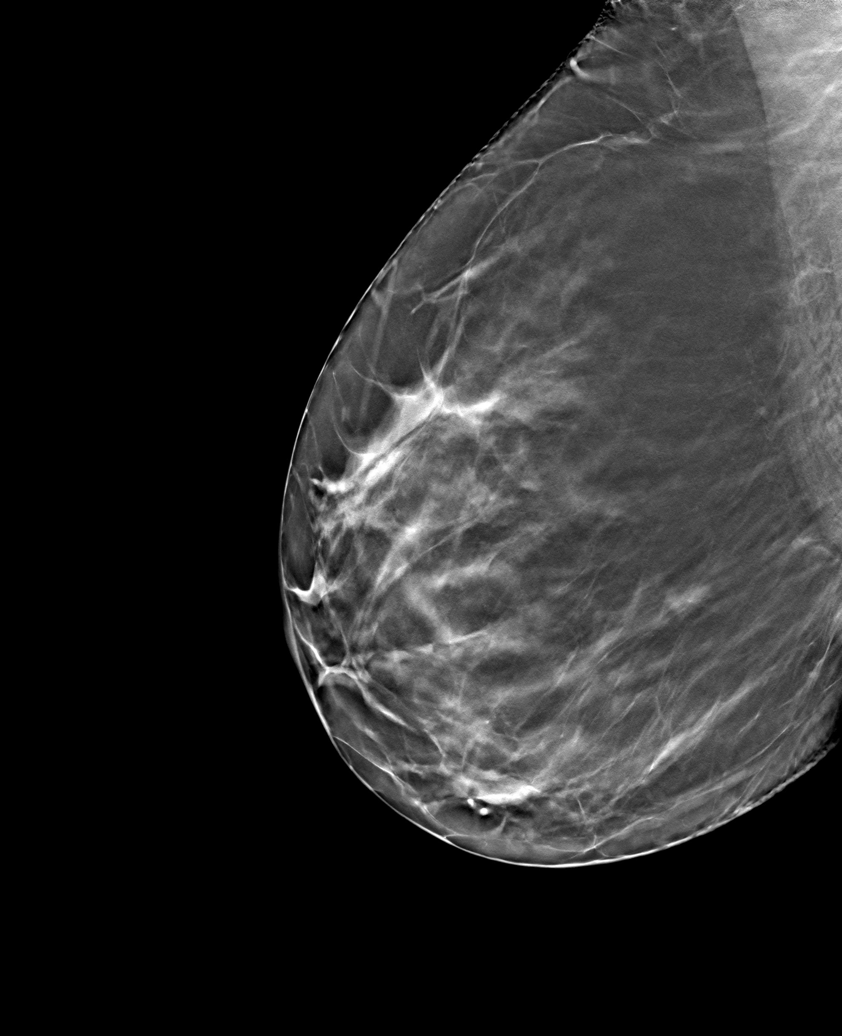

[6 of 30 positions shown; findings below may reference images not displayed]

ACR Breast Density Category c: The breast tissue is heterogeneously
dense, which may obscure small masses.
FINDINGS: The relatively lucent lobulated mass in the lower outer right breast
stable. There are no new masses, no areas of architectural
distortion and no suspicious calcifications. No mammographic change.

Mammographic images were processed with CAD.

Targeted ultrasound is performed, showing a small hypo to anechoic
mass right breast at 7 o'clock, 5 cm the nipple, measuring 5 x 4 x 4
mm, unchanged from prior exam.
IMPRESSION: 1. Probably benign right breast mass, most likely a mildly
complicated cyst, stable year. One additional follow-up study 1
year, to document over 2 years of stability, is recommended.
2. No new abnormalities.

RECOMMENDATION:
Diagnostic mammography with right breast ultrasound in 1 year.

I have discussed the findings and recommendations with the patient.
Results were also provided in writing at the conclusion of the
visit. If applicable, a reminder letter will be sent to the patient
regarding the next appointment.

BI-RADS CATEGORY  3: Probably benign.

## 2019-03-23 ENCOUNTER — Other Ambulatory Visit: Payer: Self-pay

## 2019-03-24 ENCOUNTER — Encounter: Payer: Self-pay | Admitting: Nurse Practitioner

## 2019-03-24 ENCOUNTER — Ambulatory Visit (INDEPENDENT_AMBULATORY_CARE_PROVIDER_SITE_OTHER): Payer: BLUE CROSS/BLUE SHIELD | Admitting: Nurse Practitioner

## 2019-03-24 ENCOUNTER — Ambulatory Visit (INDEPENDENT_AMBULATORY_CARE_PROVIDER_SITE_OTHER): Payer: BLUE CROSS/BLUE SHIELD

## 2019-03-24 VITALS — BP 124/76 | HR 77 | Temp 98.0°F | Ht 60.0 in | Wt 182.0 lb

## 2019-03-24 DIAGNOSIS — E785 Hyperlipidemia, unspecified: Secondary | ICD-10-CM | POA: Diagnosis not present

## 2019-03-24 DIAGNOSIS — Z0000 Encounter for general adult medical examination without abnormal findings: Secondary | ICD-10-CM

## 2019-03-24 DIAGNOSIS — Z6830 Body mass index (BMI) 30.0-30.9, adult: Secondary | ICD-10-CM

## 2019-03-24 DIAGNOSIS — R87610 Atypical squamous cells of undetermined significance on cytologic smear of cervix (ASC-US): Secondary | ICD-10-CM | POA: Diagnosis not present

## 2019-03-24 DIAGNOSIS — I10 Essential (primary) hypertension: Secondary | ICD-10-CM | POA: Diagnosis not present

## 2019-03-24 DIAGNOSIS — Z0001 Encounter for general adult medical examination with abnormal findings: Secondary | ICD-10-CM

## 2019-03-24 MED ORDER — ROSUVASTATIN CALCIUM 20 MG PO TABS: 20.0000 mg | ORAL_TABLET | Freq: Every day | ORAL | 1 refills | Status: DC

## 2019-03-24 MED ORDER — LISINOPRIL 20 MG PO TABS: 20.0000 mg | ORAL_TABLET | Freq: Every day | ORAL | 1 refills | Status: DC

## 2019-03-24 NOTE — Patient Instructions (Signed)
Exercising to Stay Healthy To become healthy and stay healthy, it is recommended that you do moderate-intensity and vigorous-intensity exercise. You can tell that you are exercising at a moderate intensity if your heart starts beating faster and you start breathing faster but can still hold a conversation. You can tell that you are exercising at a vigorous intensity if you are breathing much harder and faster and cannot hold a conversation while exercising. Exercising regularly is important. It has many health benefits, such as:  Improving overall fitness, flexibility, and endurance.  Increasing bone density.  Helping with weight control.  Decreasing body fat.  Increasing muscle strength.  Reducing stress and tension.  Improving overall health. How often should I exercise? Choose an activity that you enjoy, and set realistic goals. Your health care provider can help you make an activity plan that works for you. Exercise regularly as told by your health care provider. This may include:  Doing strength training two times a week, such as: ? Lifting weights. ? Using resistance bands. ? Push-ups. ? Sit-ups. ? Yoga.  Doing a certain intensity of exercise for a given amount of time. Choose from these options: ? A total of 150 minutes of moderate-intensity exercise every week. ? A total of 75 minutes of vigorous-intensity exercise every week. ? A mix of moderate-intensity and vigorous-intensity exercise every week. Children, pregnant women, people who have not exercised regularly, people who are overweight, and older adults may need to talk with a health care provider about what activities are safe to do. If you have a medical condition, be sure to talk with your health care provider before you start a new exercise program. What are some exercise ideas? Moderate-intensity exercise ideas include:  Walking 1 mile (1.6 km) in about 15 minutes.  Biking.  Hiking.  Golfing.  Dancing.   Water aerobics. Vigorous-intensity exercise ideas include:  Walking 4.5 miles (7.2 km) or more in about 1 hour.  Jogging or running 5 miles (8 km) in about 1 hour.  Biking 10 miles (16.1 km) or more in about 1 hour.  Lap swimming.  Roller-skating or in-line skating.  Cross-country skiing.  Vigorous competitive sports, such as football, basketball, and soccer.  Jumping rope.  Aerobic dancing. What are some everyday activities that can help me to get exercise?  Yard work, such as: ? Pushing a lawn mower. ? Raking and bagging leaves.  Washing your car.  Pushing a stroller.  Shoveling snow.  Gardening.  Washing windows or floors. How can I be more active in my day-to-day activities?  Use stairs instead of an elevator.  Take a walk during your lunch break.  If you drive, park your car farther away from your work or school.  If you take public transportation, get off one stop early and walk the rest of the way.  Stand up or walk around during all of your indoor phone calls.  Get up, stretch, and walk around every 30 minutes throughout the day.  Enjoy exercise with a friend. Support to continue exercising will help you keep a regular routine of activity. What guidelines can I follow while exercising?  Before you start a new exercise program, talk with your health care provider.  Do not exercise so much that you hurt yourself, feel dizzy, or get very short of breath.  Wear comfortable clothes and wear shoes with good support.  Drink plenty of water while you exercise to prevent dehydration or heat stroke.  Work out until your breathing   and your heartbeat get faster. Where to find more information  [place] Department of Health and Human Services: www.hhs.gov  Centers for Disease Control and Prevention (CDC): www.cdc.gov Summary  Exercising regularly is important. It will improve your overall fitness, flexibility, and endurance.  Regular exercise also will  improve your overall health. It can help you control your weight, reduce stress, and improve your bone density.  Do not exercise so much that you hurt yourself, feel dizzy, or get very short of breath.  Before you start a new exercise program, talk with your health care provider. This information is not intended to replace advice given to you by your health care provider. Make sure you discuss any questions you have with your health care provider. Document Released: 09/20/2010 Document Revised: 07/31/2017 Document Reviewed: 07/09/2017 Elsevier Patient Education  2020 Elsevier Inc.  

## 2019-03-24 NOTE — Progress Notes (Addendum)
 Subjective:    Patient ID: Grace Rodriguez, female    DOB: 11/14/1970, 48 y.o.   MRN: [id number]   Chief Complaint: Annual Exam    HPI:  1. Annual physical exam Last pap was done 10/2013 and was normal   2. Essential hypertension No c/o chest pain, sob or headache. Does not check blood pressure at home. BP Readings from Last 3 Encounters:  03/24/19 124/76  11/03/18 108/71  06/25/18 120/75     3. Hyperlipidemia with target LDL less than 100 tries to watch diet . Does no exercise  4. BMI 30.0-30.9,adult Weight is up 11lbs since last visit    Outpatient Encounter Medications as of 03/24/2019  Medication Sig  . lisinopril (ZESTRIL) 20 MG tablet Take 1 tablet (20 mg total) by mouth daily.  . rosuvastatin (CRESTOR) 20 MG tablet Take 1 tablet (20 mg total) by mouth daily.  . vitamin C (ASCORBIC ACID) 500 MG tablet Take 500 mg by mouth daily.     Past Surgical History:  Procedure Laterality Date  . CESAREAN SECTION      Family History  Problem Relation Age of Onset  . Diabetes Mother   . Hyperlipidemia Mother   . Hypertension Mother   . Cancer Maternal Aunt        Breast 2017    New complaints: None today  Social history: Lives with her husband and son  Controlled substance contract: N/A    Review of Systems  Constitutional: Negative for activity change and appetite change.  HENT: Negative.   Eyes: Negative for pain.  Respiratory: Negative for shortness of breath.   Cardiovascular: Negative for chest pain, palpitations and leg swelling.  Gastrointestinal: Negative for abdominal pain.  Endocrine: Negative for polydipsia.  Genitourinary: Negative.   Skin: Negative for rash.  Neurological: Negative for dizziness, weakness and headaches.  Hematological: Does not bruise/bleed easily.  Psychiatric/Behavioral: Negative.   All other systems reviewed and are negative.      Objective:   Physical Exam Vitals signs and nursing note reviewed.  Constitutional:       General: She is not in acute distress.    Appearance: Normal appearance. She is well-developed.  HENT:     Head: Normocephalic.     Nose: Nose normal.  Eyes:     Pupils: Pupils are equal, round, and reactive to light.  Neck:     Musculoskeletal: Normal range of motion and neck supple.     Vascular: No carotid bruit or JVD.  Cardiovascular:     Rate and Rhythm: Normal rate and regular rhythm.     Heart sounds: Normal heart sounds.  Pulmonary:     Effort: Pulmonary effort is normal. No respiratory distress.     Breath sounds: Normal breath sounds. No wheezing or rales.  Chest:     Chest wall: No tenderness.  Abdominal:     General: Bowel sounds are normal. There is no distension or abdominal bruit.     Palpations: Abdomen is soft. There is no hepatomegaly, splenomegaly, mass or pulsatile mass.     Tenderness: There is no abdominal tenderness.  Genitourinary:    General: Normal vulva.     Vagina: No vaginal discharge.     Rectum: Normal.     Comments: Cervix parous and pink No adnexal mass or tenderness Musculoskeletal: Normal range of motion.  Lymphadenopathy:     Cervical: No cervical adenopathy.  Skin:    General: Skin is warm and dry.  Neurological:       Mental Status: She is alert and oriented to person, place, and time.     Deep Tendon Reflexes: Reflexes are normal and symmetric.  Psychiatric:        Behavior: Behavior normal.        Thought Content: Thought content normal.        Judgment: Judgment normal.    BP 124/76   Pulse 77   Temp 98 F (36.7 C) (Oral)   Ht 5' (1.524 m)   Wt 182 lb (82.6 kg)   BMI 35.54 kg/m   EKG- NSR Chest xray- no acute or chronic findings     Assessment & Plan:  Grace Rodriguez comes in today with chief complaint of Annual Exam   Diagnosis and orders addressed:  1. Annual physical exam Labs pending - IGP, Aptima HPV, rfx 16/18,45 - CBC with Differential/Platelet - Thyroid Panel With TSH  2. Essential hypertension Low  sodium diet diet - CMP14+EGFR - lisinopril (ZESTRIL) 20 MG tablet; Take 1 tablet (20 mg total) by mouth daily.  Dispense: 90 tablet; Refill: 1  3. Hyperlipidemia with target LDL less than 100 Low fat diet - Lipid panel - rosuvastatin (CRESTOR) 20 MG tablet; Take 1 tablet (20 mg total) by mouth daily.  Dispense: 90 tablet; Refill: 1  4. BMI 30.0-30.9,adult Discussed diet and exercise for person with BMI >25 Will recheck weight in 3-6 months   Labs pending Health Maintenance reviewed Diet and exercise encouraged  Follow up plan: 6 months   Mary-Margaret Martin, FNP  

## 2019-03-24 NOTE — Addendum Note (Signed)
Addended by: Chevis Pretty on: 03/24/2019 10:36 AM   Modules accepted: Orders

## 2019-04-01 LAB — IGP, APTIMA HPV, RFX 16/18,45: HPV Aptima: NEGATIVE

## 2019-06-29 ENCOUNTER — Other Ambulatory Visit: Payer: Self-pay

## 2019-06-29 DIAGNOSIS — R928 Other abnormal and inconclusive findings on diagnostic imaging of breast: Secondary | ICD-10-CM

## 2019-07-22 ENCOUNTER — Other Ambulatory Visit (HOSPITAL_COMMUNITY): Payer: Self-pay | Admitting: Nurse Practitioner

## 2019-07-22 ENCOUNTER — Other Ambulatory Visit: Payer: Self-pay | Admitting: Nurse Practitioner

## 2019-07-22 DIAGNOSIS — R928 Other abnormal and inconclusive findings on diagnostic imaging of breast: Secondary | ICD-10-CM

## 2019-08-02 ENCOUNTER — Ambulatory Visit (HOSPITAL_COMMUNITY): Admission: RE | Admit: 2019-08-02 | Payer: BLUE CROSS/BLUE SHIELD | Source: Ambulatory Visit

## 2019-08-02 ENCOUNTER — Ambulatory Visit (HOSPITAL_COMMUNITY)
Admission: RE | Admit: 2019-08-02 | Discharge: 2019-08-02 | Disposition: A | Discharge status: Home / Self Care | Payer: BLUE CROSS/BLUE SHIELD | Source: Ambulatory Visit | Attending: Nurse Practitioner | Admitting: Nurse Practitioner

## 2019-08-02 ENCOUNTER — Other Ambulatory Visit: Payer: Self-pay

## 2019-08-02 DIAGNOSIS — R928 Other abnormal and inconclusive findings on diagnostic imaging of breast: Secondary | ICD-10-CM

## 2019-08-02 DIAGNOSIS — R922 Inconclusive mammogram: Secondary | ICD-10-CM | POA: Diagnosis not present

## 2019-08-02 DIAGNOSIS — N6313 Unspecified lump in the right breast, lower outer quadrant: Secondary | ICD-10-CM | POA: Diagnosis not present

## 2019-08-22 ENCOUNTER — Other Ambulatory Visit: Payer: Self-pay | Admitting: Nurse Practitioner

## 2019-08-22 DIAGNOSIS — I10 Essential (primary) hypertension: Secondary | ICD-10-CM

## 2019-09-26 ENCOUNTER — Other Ambulatory Visit: Payer: Self-pay

## 2019-09-27 ENCOUNTER — Ambulatory Visit: Payer: BLUE CROSS/BLUE SHIELD | Admitting: Nurse Practitioner

## 2019-09-27 ENCOUNTER — Encounter: Payer: Self-pay | Admitting: Nurse Practitioner

## 2019-09-27 VITALS — BP 139/86 | HR 79 | Temp 97.3°F | Resp 20 | Ht 60.0 in | Wt 187.0 lb

## 2019-09-27 DIAGNOSIS — I10 Essential (primary) hypertension: Secondary | ICD-10-CM

## 2019-09-27 DIAGNOSIS — E785 Hyperlipidemia, unspecified: Secondary | ICD-10-CM | POA: Diagnosis not present

## 2019-09-27 DIAGNOSIS — Z6836 Body mass index (BMI) 36.0-36.9, adult: Secondary | ICD-10-CM | POA: Diagnosis not present

## 2019-09-27 DIAGNOSIS — R42 Dizziness and giddiness: Secondary | ICD-10-CM | POA: Diagnosis not present

## 2019-09-27 MED ORDER — ROSUVASTATIN CALCIUM 20 MG PO TABS: 20.0000 mg | ORAL_TABLET | Freq: Every day | ORAL | 1 refills | Status: DC

## 2019-09-27 NOTE — Progress Notes (Signed)
Subjective:    Patient ID: Grace Rodriguez, female    DOB: 05/01/1971, 49 y.o.   MRN: [id number]   Chief Complaint: Medical Management of Chronic Issues (Dizziness)    HPI:  1. Essential hypertension No c/o chest pain, sob or headache. Does not check blood pressure at home. BP Readings from Last 3 Encounters:  09/27/19 139/86  03/24/19 124/76  11/03/18 108/71     2. Hyperlipidemia with target LDL less than 100 She is not watching diet and is doing no exercise. She does stay active though. Lab Results  Component Value Date   CHOL 104 06/25/2018   HDL 45 06/25/2018   LDLCALC 49 06/25/2018   TRIG 52 06/25/2018   CHOLHDL 2.3 06/25/2018     3. BMI 36.0-36.9,adult Weight is up 5lbs form last viist. Wt Readings from Last 3 Encounters:  09/27/19 187 lb (84.8 kg)  03/24/19 182 lb (82.6 kg)  11/03/18 167 lb 12.8 oz (76.1 kg)   BMI Readings from Last 3 Encounters:  09/27/19 36.52 kg/m  03/24/19 35.54 kg/m  11/03/18 32.77 kg/m       Outpatient Encounter Medications as of 09/27/2019  Medication Sig  . lisinopril (ZESTRIL) 20 MG tablet TAKE ONE (1) TABLET EACH DAY  . rosuvastatin (CRESTOR) 20 MG tablet Take 1 tablet (20 mg total) by mouth daily.  . vitamin C (ASCORBIC ACID) 500 MG tablet Take 500 mg by mouth daily.     Past Surgical History:  Procedure Laterality Date  . CESAREAN SECTION      Family History  Problem Relation Age of Onset  . Diabetes Mother   . Hyperlipidemia Mother   . Hypertension Mother   . Cancer Maternal Aunt        Breast 2017    New complaint: Having dizzy spells when she lays down or when she bends over. Has been going on for about 2 weeks.  Social history: Lives with her husband and her son  Controlled substance contract: n/a    Review of Systems  Constitutional: Negative for diaphoresis.  Eyes: Negative for pain.  Respiratory: Negative for shortness of breath.   Cardiovascular: Negative for chest pain, palpitations and leg  swelling.  Gastrointestinal: Negative for abdominal pain.  Endocrine: Negative for polydipsia.  Skin: Negative for rash.  Neurological: Negative for dizziness, weakness and headaches.  Hematological: Does not bruise/bleed easily.  All other systems reviewed and are negative.      Objective:   Physical Exam Vitals and nursing note reviewed.  Constitutional:      General: She is not in acute distress.    Appearance: Normal appearance. She is well-developed.  HENT:     Head: Normocephalic.     Nose: Nose normal.  Eyes:     Pupils: Pupils are equal, round, and reactive to light.  Neck:     Vascular: No carotid bruit or JVD.  Cardiovascular:     Rate and Rhythm: Normal rate and regular rhythm.     Heart sounds: Normal heart sounds.  Pulmonary:     Effort: Pulmonary effort is normal. No respiratory distress.     Breath sounds: Normal breath sounds. No wheezing or rales.  Chest:     Chest wall: No tenderness.  Abdominal:     General: Bowel sounds are normal. There is no distension or abdominal bruit.     Palpations: Abdomen is soft. There is no hepatomegaly, splenomegaly, mass or pulsatile mass.     Tenderness: There is no abdominal  tenderness.  Musculoskeletal:        General: Normal range of motion.     Cervical back: Normal range of motion and neck supple.  Lymphadenopathy:     Cervical: No cervical adenopathy.  Skin:    General: Skin is warm and dry.  Neurological:     Mental Status: She is alert and oriented to person, place, and time.     Cranial Nerves: No cranial nerve deficit.     Deep Tendon Reflexes: Reflexes are normal and symmetric.  Psychiatric:        Behavior: Behavior normal.        Thought Content: Thought content normal.        Judgment: Judgment normal.    BP 139/86   Pulse 79   Temp (!) 97.3 F (36.3 C) (Temporal)   Resp 20   Ht 5' (1.524 m)   Wt 187 lb (84.8 kg)   SpO2 100%   BMI 36.52 kg/m          Assessment & Plan:  Grace Rodriguez  comes in today with chief complaint of Medical Management of Chronic Issues (Dizziness)   Diagnosis and orders addressed:  1. Essential hypertension Low sodium diet - CMP14+EGFR  2. Hyperlipidemia with target LDL less than 100 Low fat diet - rosuvastatin (CRESTOR) 20 MG tablet; Take 1 tablet (20 mg total) by mouth daily.  Dispense: 90 tablet; Refill: 1 - Lipid panel  3. BMI 36.0-36.9,adult Discussed diet and exercise for person with BMI >25 Will recheck weight in 3-6 months  4. Dizziness Force fluids for now - Thyroid Panel With TSH - CBC with Differential/Platelet   Labs pending Health Maintenance reviewed Diet and exercise encouraged  Follow up plan: 6 months   Carle Place, FNP

## 2019-09-27 NOTE — Patient Instructions (Signed)

## 2019-09-28 LAB — CMP14+EGFR
ALT: 25 IU/L (ref 0–32)
AST: 27 IU/L (ref 0–40)
Albumin/Globulin Ratio: 1.5 (ref 1.2–2.2)
Albumin: 4.6 g/dL (ref 3.8–4.8)
Alkaline Phosphatase: 128 IU/L — ABNORMAL HIGH (ref 39–117)
BUN/Creatinine Ratio: 15 (ref 9–23)
BUN: 9 mg/dL (ref 6–24)
Bilirubin Total: 0.5 mg/dL (ref 0.0–1.2)
CO2: 23 mmol/L (ref 20–29)
Calcium: 9.3 mg/dL (ref 8.7–10.2)
Chloride: 103 mmol/L (ref 96–106)
Creatinine, Ser: 0.61 mg/dL (ref 0.57–1.00)
GFR calc Af Amer: 122 mL/min/{1.73_m2} (ref >59)
GFR calc non Af Amer: 106 mL/min/{1.73_m2} (ref >59)
Globulin, Total: 3.1 g/dL (ref 1.5–4.5)
Glucose: 94 mg/dL (ref 65–99)
Potassium: 4.2 mmol/L (ref 3.5–5.2)
Sodium: 142 mmol/L (ref 134–144)
Total Protein: 7.7 g/dL (ref 6.0–8.5)

## 2019-09-28 LAB — CBC WITH DIFFERENTIAL/PLATELET
Basophils Absolute: 0.1 10*3/uL (ref 0.0–0.2)
Basos: 1 %
EOS (ABSOLUTE): 0.2 10*3/uL (ref 0.0–0.4)
Eos: 2 %
Hematocrit: 40.3 % (ref 34.0–46.6)
Hemoglobin: 13.1 g/dL (ref 11.1–15.9)
Immature Grans (Abs): 0 10*3/uL (ref 0.0–0.1)
Immature Granulocytes: 0 %
Lymphocytes Absolute: 2.3 10*3/uL (ref 0.7–3.1)
Lymphs: 26 %
MCH: 29.2 pg (ref 26.6–33.0)
MCHC: 32.5 g/dL (ref 31.5–35.7)
MCV: 90 fL (ref 79–97)
Monocytes Absolute: 0.6 10*3/uL (ref 0.1–0.9)
Monocytes: 6 %
Neutrophils Absolute: 5.8 10*3/uL (ref 1.4–7.0)
Neutrophils: 65 %
Platelets: 398 10*3/uL (ref 150–450)
RBC: 4.49 x10E6/uL (ref 3.77–5.28)
RDW: 14.3 % (ref 11.7–15.4)
WBC: 8.9 10*3/uL (ref 3.4–10.8)

## 2019-09-28 LAB — LIPID PANEL
Chol/HDL Ratio: 2.7 ratio (ref 0.0–4.4)
Cholesterol, Total: 121 mg/dL (ref 100–199)
HDL: 45 mg/dL (ref >39)
LDL Chol Calc (NIH): 56 mg/dL (ref 0–99)
Triglycerides: 107 mg/dL (ref 0–149)
VLDL Cholesterol Cal: 20 mg/dL (ref 5–40)

## 2019-09-28 LAB — THYROID PANEL WITH TSH
Free Thyroxine Index: 1.1 — ABNORMAL LOW (ref 1.2–4.9)
T3 Uptake Ratio: 20 % — ABNORMAL LOW (ref 24–39)
T4, Total: 5.5 ug/dL (ref 4.5–12.0)
TSH: 4.89 u[IU]/mL — ABNORMAL HIGH (ref 0.450–4.500)

## 2020-02-12 DIAGNOSIS — G8911 Acute pain due to trauma: Secondary | ICD-10-CM | POA: Diagnosis not present

## 2020-02-12 DIAGNOSIS — I10 Essential (primary) hypertension: Secondary | ICD-10-CM | POA: Diagnosis not present

## 2020-02-12 DIAGNOSIS — E785 Hyperlipidemia, unspecified: Secondary | ICD-10-CM | POA: Diagnosis not present

## 2020-02-12 DIAGNOSIS — I700 Atherosclerosis of aorta: Secondary | ICD-10-CM | POA: Diagnosis not present

## 2020-02-12 DIAGNOSIS — R1031 Right lower quadrant pain: Secondary | ICD-10-CM | POA: Diagnosis not present

## 2020-02-18 ENCOUNTER — Other Ambulatory Visit: Payer: Self-pay | Admitting: Nurse Practitioner

## 2020-02-18 DIAGNOSIS — I10 Essential (primary) hypertension: Secondary | ICD-10-CM

## 2020-03-21 DIAGNOSIS — N390 Urinary tract infection, site not specified: Secondary | ICD-10-CM | POA: Diagnosis not present

## 2020-03-21 DIAGNOSIS — R300 Dysuria: Secondary | ICD-10-CM | POA: Diagnosis not present

## 2020-03-21 DIAGNOSIS — R319 Hematuria, unspecified: Secondary | ICD-10-CM | POA: Diagnosis not present

## 2020-03-26 ENCOUNTER — Ambulatory Visit: Payer: BLUE CROSS/BLUE SHIELD | Admitting: Nurse Practitioner

## 2020-03-26 ENCOUNTER — Encounter: Payer: Self-pay | Admitting: Nurse Practitioner

## 2020-03-26 ENCOUNTER — Other Ambulatory Visit: Payer: Self-pay

## 2020-03-26 VITALS — BP 122/79 | HR 84 | Temp 97.9°F | Resp 20 | Ht 60.0 in | Wt 188.0 lb

## 2020-03-26 DIAGNOSIS — I10 Essential (primary) hypertension: Secondary | ICD-10-CM | POA: Diagnosis not present

## 2020-03-26 DIAGNOSIS — Z6830 Body mass index (BMI) 30.0-30.9, adult: Secondary | ICD-10-CM | POA: Diagnosis not present

## 2020-03-26 DIAGNOSIS — E785 Hyperlipidemia, unspecified: Secondary | ICD-10-CM

## 2020-03-26 MED ORDER — ROSUVASTATIN CALCIUM 20 MG PO TABS: 20.0000 mg | ORAL_TABLET | Freq: Every day | ORAL | 1 refills | Status: DC

## 2020-03-26 MED ORDER — LISINOPRIL 20 MG PO TABS: ORAL_TABLET | ORAL | 1 refills | Status: DC

## 2020-03-26 NOTE — Patient Instructions (Signed)
Exercising to Stay Healthy To become healthy and stay healthy, it is recommended that you do moderate-intensity and vigorous-intensity exercise. You can tell that you are exercising at a moderate intensity if your heart starts beating faster and you start breathing faster but can still hold a conversation. You can tell that you are exercising at a vigorous intensity if you are breathing much harder and faster and cannot hold a conversation while exercising. Exercising regularly is important. It has many health benefits, such as:  Improving overall fitness, flexibility, and endurance.  Increasing bone density.  Helping with weight control.  Decreasing body fat.  Increasing muscle strength.  Reducing stress and tension.  Improving overall health. How often should I exercise? Choose an activity that you enjoy, and set realistic goals. Your health care provider can help you make an activity plan that works for you. Exercise regularly as told by your health care provider. This may include:  Doing strength training two times a week, such as: ? Lifting weights. ? Using resistance bands. ? Push-ups. ? Sit-ups. ? Yoga.  Doing a certain intensity of exercise for a given amount of time. Choose from these options: ? A total of 150 minutes of moderate-intensity exercise every week. ? A total of 75 minutes of vigorous-intensity exercise every week. ? A mix of moderate-intensity and vigorous-intensity exercise every week. Children, pregnant women, people who have not exercised regularly, people who are overweight, and older adults may need to talk with a health care provider about what activities are safe to do. If you have a medical condition, be sure to talk with your health care provider before you start a new exercise program. What are some exercise ideas? Moderate-intensity exercise ideas include:  Walking 1 mile (1.6 km) in about 15  minutes.  Biking.  Hiking.  Golfing.  Dancing.  Water aerobics. Vigorous-intensity exercise ideas include:  Walking 4.5 miles (7.2 km) or more in about 1 hour.  Jogging or running 5 miles (8 km) in about 1 hour.  Biking 10 miles (16.1 km) or more in about 1 hour.  Lap swimming.  Roller-skating or in-line skating.  Cross-country skiing.  Vigorous competitive sports, such as football, basketball, and soccer.  Jumping rope.  Aerobic dancing. What are some everyday activities that can help me to get exercise?  Yard work, such as: ? Pushing a lawn mower. ? Raking and bagging leaves.  Washing your car.  Pushing a stroller.  Shoveling snow.  Gardening.  Washing windows or floors. How can I be more active in my day-to-day activities?  Use stairs instead of an elevator.  Take a walk during your lunch break.  If you drive, park your car farther away from your work or school.  If you take public transportation, get off one stop early and walk the rest of the way.  Stand up or walk around during all of your indoor phone calls.  Get up, stretch, and walk around every 30 minutes throughout the day.  Enjoy exercise with a friend. Support to continue exercising will help you keep a regular routine of activity. What guidelines can I follow while exercising?  Before you start a new exercise program, talk with your health care provider.  Do not exercise so much that you hurt yourself, feel dizzy, or get very short of breath.  Wear comfortable clothes and wear shoes with good support.  Drink plenty of water while you exercise to prevent dehydration or heat stroke.  Work out until your breathing   and your heartbeat get faster. Where to find more information  [place] Department of Health and Human Services: www.hhs.gov  Centers for Disease Control and Prevention (CDC): www.cdc.gov Summary  Exercising regularly is important. It will improve your overall fitness,  flexibility, and endurance.  Regular exercise also will improve your overall health. It can help you control your weight, reduce stress, and improve your bone density.  Do not exercise so much that you hurt yourself, feel dizzy, or get very short of breath.  Before you start a new exercise program, talk with your health care provider. This information is not intended to replace advice given to you by your health care provider. Make sure you discuss any questions you have with your health care provider. Document Revised: 07/31/2017 Document Reviewed: 07/09/2017 Elsevier Patient Education  2020 Elsevier Inc.  

## 2020-03-26 NOTE — Progress Notes (Signed)
Subjective:    Patient ID: Grace Rodriguez, female    DOB: 1971-06-19, 49 y.o.   MRN: [id number]   Chief Complaint: Medical Management of Chronic Issues    HPI:  1. Essential hypertension No c/o chest pain, sob or headache. Does not check blood pressure at home. BP Readings from Last 3 Encounters:  03/26/20 122/79  09/27/19 139/86  03/24/19 124/76     2. Hyperlipidemia with target LDL less than 100 Tries to watch diet but does little to no exercise. Lab Results  Component Value Date   CHOL 121 09/27/2019   HDL 45 09/27/2019   LDLCALC 56 09/27/2019   TRIG 107 09/27/2019   CHOLHDL 2.7 09/27/2019    3. BMI 30.0-30.9,adult No recent weight changes Wt Readings from Last 3 Encounters:  03/26/20 188 lb (85.3 kg)  09/27/19 187 lb (84.8 kg)  03/24/19 182 lb (82.6 kg)   BMI Readings from Last 3 Encounters:  03/26/20 36.72 kg/m  09/27/19 36.52 kg/m  03/24/19 35.54 kg/m       Outpatient Encounter Medications as of 03/26/2020  Medication Sig  . cephALEXin (KEFLEX) 500 MG capsule Take 500 mg by mouth 4 (four) times daily.  Marland Kitchen lisinopril (ZESTRIL) 20 MG tablet TAKE ONE (1) TABLET EACH DAY  . rosuvastatin (CRESTOR) 20 MG tablet Take 1 tablet (20 mg total) by mouth daily.  . vitamin C (ASCORBIC ACID) 500 MG tablet Take 500 mg by mouth daily.     Past Surgical History:  Procedure Laterality Date  . CESAREAN SECTION      Family History  Problem Relation Age of Onset  . Diabetes Mother   . Hyperlipidemia Mother   . Hypertension Mother   . Cancer Maternal Aunt        Breast 2017    New complaints: None today  Social history: Lives with her husband and sone. She doe snot work.  Controlled substance contract: *n/a    Review of Systems  Constitutional: Negative for diaphoresis.  Eyes: Negative for pain.  Respiratory: Negative for shortness of breath.   Cardiovascular: Negative for chest pain, palpitations and leg swelling.  Gastrointestinal: Negative for  abdominal pain.  Endocrine: Negative for polydipsia.  Skin: Negative for rash.  Neurological: Negative for dizziness, weakness and headaches.  Hematological: Does not bruise/bleed easily.  All other systems reviewed and are negative.      Objective:   Physical Exam Vitals and nursing note reviewed.  Constitutional:      General: She is not in acute distress.    Appearance: Normal appearance. She is well-developed.  HENT:     Head: Normocephalic.     Nose: Nose normal.  Eyes:     Pupils: Pupils are equal, round, and reactive to light.  Neck:     Vascular: No carotid bruit or JVD.  Cardiovascular:     Rate and Rhythm: Normal rate and regular rhythm.     Heart sounds: Normal heart sounds.  Pulmonary:     Effort: Pulmonary effort is normal. No respiratory distress.     Breath sounds: Normal breath sounds. No wheezing or rales.  Chest:     Chest wall: No tenderness.  Abdominal:     General: Bowel sounds are normal. There is no distension or abdominal bruit.     Palpations: Abdomen is soft. There is no hepatomegaly, splenomegaly, mass or pulsatile mass.     Tenderness: There is no abdominal tenderness.  Musculoskeletal:        General:  Normal range of motion.     Cervical back: Normal range of motion and neck supple.  Lymphadenopathy:     Cervical: No cervical adenopathy.  Skin:    General: Skin is warm and dry.  Neurological:     Mental Status: She is alert and oriented to person, place, and time.     Deep Tendon Reflexes: Reflexes are normal and symmetric.  Psychiatric:        Behavior: Behavior normal.        Thought Content: Thought content normal.        Judgment: Judgment normal.    BP 122/79   Pulse 84   Temp 97.9 F (36.6 C) (Temporal)   Resp 20   Ht 5' (1.524 m)   Wt 188 lb (85.3 kg)   SpO2 96%   BMI 36.72 kg/m         Assessment & Plan:  Grace Rodriguez comes in today with chief complaint of Medical Management of Chronic Issues   Diagnosis and  orders addressed:  1. Essential hypertension Low sodium diet - CMP14+EGFR - CBC with Differential/Platelet - lisinopril (ZESTRIL) 20 MG tablet; TAKE ONE (1) TABLET EACH DAY  Dispense: 90 tablet; Refill: 1  2. Hyperlipidemia with target LDL less than 100 Low fat diet - Lipid panel - rosuvastatin (CRESTOR) 20 MG tablet; Take 1 tablet (20 mg total) by mouth daily.  Dispense: 90 tablet; Refill: 1  3. BMI 30.0-30.9,adult Discussed diet and exercise for person with BMI >25 Will recheck weight in 3-6 months    Labs pending Health Maintenance reviewed Diet and exercise encouraged  Follow up plan: 6 months   Mary-Margaret Hassell Done, FNP

## 2020-03-27 LAB — CMP14+EGFR
ALT: 27 IU/L (ref 0–32)
AST: 39 IU/L (ref 0–40)
Albumin/Globulin Ratio: 1.5 (ref 1.2–2.2)
Albumin: 4.5 g/dL (ref 3.8–4.9)
Alkaline Phosphatase: 124 IU/L — ABNORMAL HIGH (ref 48–121)
BUN/Creatinine Ratio: 14 (ref 9–23)
BUN: 10 mg/dL (ref 6–24)
Bilirubin Total: 0.4 mg/dL (ref 0.0–1.2)
CO2: 23 mmol/L (ref 20–29)
Calcium: 9.3 mg/dL (ref 8.7–10.2)
Chloride: 99 mmol/L (ref 96–106)
Creatinine, Ser: 0.74 mg/dL (ref 0.57–1.00)
GFR calc Af Amer: 108 mL/min/{1.73_m2} (ref >59)
GFR calc non Af Amer: 94 mL/min/{1.73_m2} (ref >59)
Globulin, Total: 3 g/dL (ref 1.5–4.5)
Glucose: 96 mg/dL (ref 65–99)
Potassium: 4.1 mmol/L (ref 3.5–5.2)
Sodium: 143 mmol/L (ref 134–144)
Total Protein: 7.5 g/dL (ref 6.0–8.5)

## 2020-03-27 LAB — CBC WITH DIFFERENTIAL/PLATELET
Basophils Absolute: 0.1 10*3/uL (ref 0.0–0.2)
Basos: 1 %
EOS (ABSOLUTE): 0.2 10*3/uL (ref 0.0–0.4)
Eos: 2 %
Hematocrit: 40.7 % (ref 34.0–46.6)
Hemoglobin: 13.1 g/dL (ref 11.1–15.9)
Immature Grans (Abs): 0.1 10*3/uL (ref 0.0–0.1)
Immature Granulocytes: 1 %
Lymphocytes Absolute: 2.1 10*3/uL (ref 0.7–3.1)
Lymphs: 21 %
MCH: 28.7 pg (ref 26.6–33.0)
MCHC: 32.2 g/dL (ref 31.5–35.7)
MCV: 89 fL (ref 79–97)
Monocytes Absolute: 0.6 10*3/uL (ref 0.1–0.9)
Monocytes: 6 %
Neutrophils Absolute: 7.1 10*3/uL — ABNORMAL HIGH (ref 1.4–7.0)
Neutrophils: 69 %
Platelets: 398 10*3/uL (ref 150–450)
RBC: 4.57 x10E6/uL (ref 3.77–5.28)
RDW: 14.2 % (ref 11.7–15.4)
WBC: 10.2 10*3/uL (ref 3.4–10.8)

## 2020-03-27 LAB — LIPID PANEL
Chol/HDL Ratio: 2.9 ratio (ref 0.0–4.4)
Cholesterol, Total: 115 mg/dL (ref 100–199)
HDL: 39 mg/dL — ABNORMAL LOW (ref >39)
LDL Chol Calc (NIH): 58 mg/dL (ref 0–99)
Triglycerides: 97 mg/dL (ref 0–149)
VLDL Cholesterol Cal: 18 mg/dL (ref 5–40)

## 2020-08-09 ENCOUNTER — Other Ambulatory Visit (HOSPITAL_COMMUNITY): Payer: Self-pay | Admitting: Nurse Practitioner

## 2020-08-09 DIAGNOSIS — Z1231 Encounter for screening mammogram for malignant neoplasm of breast: Secondary | ICD-10-CM

## 2020-09-07 ENCOUNTER — Ambulatory Visit (HOSPITAL_COMMUNITY)
Admission: RE | Admit: 2020-09-07 | Discharge: 2020-09-07 | Disposition: A | Discharge status: Home / Self Care | Payer: BC Managed Care – PPO | Source: Ambulatory Visit | Attending: Nurse Practitioner | Admitting: Nurse Practitioner

## 2020-09-07 ENCOUNTER — Other Ambulatory Visit: Payer: Self-pay

## 2020-09-07 DIAGNOSIS — Z1231 Encounter for screening mammogram for malignant neoplasm of breast: Secondary | ICD-10-CM | POA: Diagnosis not present

## 2020-09-26 ENCOUNTER — Other Ambulatory Visit: Payer: Self-pay

## 2020-09-26 ENCOUNTER — Ambulatory Visit (INDEPENDENT_AMBULATORY_CARE_PROVIDER_SITE_OTHER): Payer: BC Managed Care – PPO | Admitting: Nurse Practitioner

## 2020-09-26 ENCOUNTER — Other Ambulatory Visit (HOSPITAL_COMMUNITY)
Admission: RE | Admit: 2020-09-26 | Discharge: 2020-09-26 | Disposition: A | Discharge status: Home / Self Care | Payer: BC Managed Care – PPO | Source: Ambulatory Visit | Attending: Nurse Practitioner | Admitting: Nurse Practitioner

## 2020-09-26 ENCOUNTER — Encounter: Payer: Self-pay | Admitting: Nurse Practitioner

## 2020-09-26 VITALS — BP 126/85 | HR 85 | Temp 96.7°F | Resp 20 | Ht 60.0 in | Wt 169.0 lb

## 2020-09-26 DIAGNOSIS — Z0000 Encounter for general adult medical examination without abnormal findings: Secondary | ICD-10-CM

## 2020-09-26 DIAGNOSIS — I10 Essential (primary) hypertension: Secondary | ICD-10-CM | POA: Diagnosis not present

## 2020-09-26 DIAGNOSIS — Z0001 Encounter for general adult medical examination with abnormal findings: Secondary | ICD-10-CM

## 2020-09-26 DIAGNOSIS — Z6830 Body mass index (BMI) 30.0-30.9, adult: Secondary | ICD-10-CM

## 2020-09-26 DIAGNOSIS — E785 Hyperlipidemia, unspecified: Secondary | ICD-10-CM | POA: Diagnosis not present

## 2020-09-26 DIAGNOSIS — Z1211 Encounter for screening for malignant neoplasm of colon: Secondary | ICD-10-CM

## 2020-09-26 DIAGNOSIS — Z87898 Personal history of other specified conditions: Secondary | ICD-10-CM | POA: Insufficient documentation

## 2020-09-26 DIAGNOSIS — Z01419 Encounter for gynecological examination (general) (routine) without abnormal findings: Secondary | ICD-10-CM | POA: Diagnosis not present

## 2020-09-26 DIAGNOSIS — Z1212 Encounter for screening for malignant neoplasm of rectum: Secondary | ICD-10-CM

## 2020-09-26 LAB — URINALYSIS, COMPLETE
Bilirubin, UA: NEGATIVE
Glucose, UA: NEGATIVE
Ketones, UA: NEGATIVE
Leukocytes,UA: NEGATIVE
Nitrite, UA: NEGATIVE
Protein,UA: NEGATIVE
Specific Gravity, UA: 1.02 (ref 1.005–1.030)
Urobilinogen, Ur: 0.2 mg/dL (ref 0.2–1.0)
pH, UA: 6 (ref 5.0–7.5)

## 2020-09-26 LAB — MICROSCOPIC EXAMINATION: WBC, UA: NONE SEEN /hpf (ref 0–5)

## 2020-09-26 MED ORDER — ROSUVASTATIN CALCIUM 20 MG PO TABS: 20.0000 mg | ORAL_TABLET | Freq: Every day | ORAL | 1 refills | Status: DC

## 2020-09-26 MED ORDER — LISINOPRIL 20 MG PO TABS: ORAL_TABLET | ORAL | 1 refills | Status: DC

## 2020-09-26 NOTE — Progress Notes (Signed)
Subjective:    Patient ID: Grace Rodriguez, female    DOB: 1971/04/23, 50 y.o.   MRN: [id number]   Chief Complaint:  Annual physical   HPI:  1. History of abnormal cells from cervix Last pap on 03/24/19 showed ASCUS.   2. Primary hypertension No c/o chest pain, sob or headache. Doe snot check blood pressure at home. BP Readings from Last 3 Encounters:  09/26/20 126/85  03/26/20 122/79  09/27/19 139/86     3. Hyperlipidemia with target LDL less than 100 Tries to watch diet but does little to no exercise. Lab Results  Component Value Date   CHOL 115 03/26/2020   HDL 39 (L) 03/26/2020   LDLCALC 58 03/26/2020   TRIG 97 03/26/2020   CHOLHDL 2.9 03/26/2020     4. BMI 30.0-30.9,adult Weight is down 13 lbs since last visit Wt Readings from Last 3 Encounters:  09/26/20 169 lb (76.7 kg)  03/26/20 188 lb (85.3 kg)  09/27/19 187 lb (84.8 kg)    BMI Readings from Last 3 Encounters:  09/26/20 33.01 kg/m  03/26/20 36.72 kg/m  09/27/19 36.52 kg/m      Outpatient Encounter Medications as of 09/26/2020  Medication Sig  . lisinopril (ZESTRIL) 20 MG tablet TAKE ONE (1) TABLET EACH DAY  . rosuvastatin (CRESTOR) 20 MG tablet Take 1 tablet (20 mg total) by mouth daily.  . vitamin C (ASCORBIC ACID) 500 MG tablet Take 500 mg by mouth daily.   No facility-administered encounter medications on file as of 09/26/2020.    Past Surgical History:  Procedure Laterality Date  . CESAREAN SECTION      Family History  Problem Relation Age of Onset  . Diabetes Mother   . Hyperlipidemia Mother   . Hypertension Mother   . 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  Eyes: Negative for pain.  Respiratory: Negative for shortness of breath.   Cardiovascular: Negative for chest pain,  palpitations and leg swelling.  Gastrointestinal: Negative for abdominal pain.  Endocrine: Negative for polydipsia.  Skin: Negative for rash.  Neurological: Negative for dizziness, weakness and headaches.  Hematological: Does not bruise/bleed easily.  All other systems reviewed and are negative.      Objective:   Physical Exam Vitals and nursing note reviewed.  Constitutional:      General: She is not in acute distress.    Appearance: Normal appearance. She is well-developed and well-nourished.  HENT:     Head: Normocephalic.     Nose: Nose normal.     Mouth/Throat:     Mouth: Oropharynx is clear and moist.  Eyes:     Extraocular Movements: EOM normal.     Pupils: Pupils are equal, round, and reactive to light.  Neck:     Vascular: No carotid bruit or JVD.  Cardiovascular:     Rate and Rhythm: Normal rate and regular rhythm.     Pulses: Intact distal pulses.     Heart sounds: Normal heart sounds.  Pulmonary:     Effort: Pulmonary effort is normal. No respiratory distress.     Breath sounds: Normal breath sounds. No wheezing or rales.  Chest:     Chest wall: No tenderness.  Abdominal:     General: Bowel sounds are  normal. There is no distension or abdominal bruit. Aorta is normal.     Palpations: Abdomen is soft. There is no hepatomegaly, splenomegaly, mass or pulsatile mass.     Tenderness: There is no abdominal tenderness.  Genitourinary:    General: Normal vulva.     Vagina: No vaginal discharge.     Rectum: Normal.     Comments: Cervix very difficult to find No adnexal masses or tenderness Musculoskeletal:        General: No edema. Normal range of motion.     Cervical back: Normal range of motion and neck supple.  Lymphadenopathy:     Cervical: No cervical adenopathy.  Skin:    General: Skin is warm and dry.  Neurological:     Mental Status: She is alert and oriented to person, place, and time.     Deep Tendon Reflexes: Reflexes are normal and symmetric.   Psychiatric:        Mood and Affect: Mood and affect normal.        Behavior: Behavior normal.        Thought Content: Thought content normal.        Judgment: Judgment normal.    BP 126/85   Pulse 85   Temp (!) 96.7 F (35.9 C) (Temporal)   Resp 20   Ht 5' (1.524 m)   Wt 169 lb (76.7 kg)   SpO2 99%   BMI 33.01 kg/m         Assessment & Plan:  Grace Rodriguez comes in today with chief complaint of Annual Exam   Diagnosis and orders addressed:  1. Annual physical exam Labs pending - CBC with Differential/Platelet - Thyroid Panel With TSH  2. History of abnormal cells from cervix Pap results pending - Urinalysis, Complete - Cytology - PAP  3. Primary hypertension Low sodium diet - CMP14+EGFR - lisinopril (ZESTRIL) 20 MG tablet; TAKE ONE (1) TABLET EACH DAY  Dispense: 90 tablet; Refill: 1  4. Hyperlipidemia with target LDL less than 100 Low fat diet - rosuvastatin (CRESTOR) 20 MG tablet; Take 1 tablet (20 mg total) by mouth daily.  Dispense: 90 tablet; Refill: 1 - Lipid panel  5. BMI 30.0-30.9,adult Discussed diet and exercise for person with BMI >25 Will recheck weight in 3-6 months   6. Encounter for screening for colorectal malignant neoplasm - Cologuard   Labs pending Health Maintenance reviewed Diet and exercise encouraged  Follow up plan: 6 months   Mary-Margaret Hassell Done, FNP

## 2020-09-26 NOTE — Patient Instructions (Signed)
Exercising to Stay Healthy To become healthy and stay healthy, it is recommended that you do moderate-intensity and vigorous-intensity exercise. You can tell that you are exercising at a moderate intensity if your heart starts beating faster and you start breathing faster but can still hold a conversation. You can tell that you are exercising at a vigorous intensity if you are breathing much harder and faster and cannot hold a conversation while exercising. Exercising regularly is important. It has many health benefits, such as:  Improving overall fitness, flexibility, and endurance.  Increasing bone density.  Helping with weight control.  Decreasing body fat.  Increasing muscle strength.  Reducing stress and tension.  Improving overall health. How often should I exercise? Choose an activity that you enjoy, and set realistic goals. Your health care provider can help you make an activity plan that works for you. Exercise regularly as told by your health care provider. This may include:  Doing strength training two times a week, such as: ? Lifting weights. ? Using resistance bands. ? Push-ups. ? Sit-ups. ? Yoga.  Doing a certain intensity of exercise for a given amount of time. Choose from these options: ? A total of 150 minutes of moderate-intensity exercise every week. ? A total of 75 minutes of vigorous-intensity exercise every week. ? A mix of moderate-intensity and vigorous-intensity exercise every week. Children, pregnant women, people who have not exercised regularly, people who are overweight, and older adults may need to talk with a health care provider about what activities are safe to do. If you have a medical condition, be sure to talk with your health care provider before you start a new exercise program. What are some exercise ideas? Moderate-intensity exercise ideas include:  Walking 1 mile (1.6 km) in about 15  minutes.  Biking.  Hiking.  Golfing.  Dancing.  Water aerobics. Vigorous-intensity exercise ideas include:  Walking 4.5 miles (7.2 km) or more in about 1 hour.  Jogging or running 5 miles (8 km) in about 1 hour.  Biking 10 miles (16.1 km) or more in about 1 hour.  Lap swimming.  Roller-skating or in-line skating.  Cross-country skiing.  Vigorous competitive sports, such as football, basketball, and soccer.  Jumping rope.  Aerobic dancing.   What are some everyday activities that can help me to get exercise?  Yard work, such as: ? Pushing a lawn mower. ? Raking and bagging leaves.  Washing your car.  Pushing a stroller.  Shoveling snow.  Gardening.  Washing windows or floors. How can I be more active in my day-to-day activities?  Use stairs instead of an elevator.  Take a walk during your lunch break.  If you drive, park your car farther away from your work or school.  If you take public transportation, get off one stop early and walk the rest of the way.  Stand up or walk around during all of your indoor phone calls.  Get up, stretch, and walk around every 30 minutes throughout the day.  Enjoy exercise with a friend. Support to continue exercising will help you keep a regular routine of activity. What guidelines can I follow while exercising?  Before you start a new exercise program, talk with your health care provider.  Do not exercise so much that you hurt yourself, feel dizzy, or get very short of breath.  Wear comfortable clothes and wear shoes with good support.  Drink plenty of water while you exercise to prevent dehydration or heat stroke.  Work out until   your breathing and your heartbeat get faster. Where to find more information  [place] Department of Health and Human Services: www.hhs.gov  Centers for Disease Control and Prevention (CDC): www.cdc.gov Summary  Exercising regularly is important. It will improve your overall fitness,  flexibility, and endurance.  Regular exercise also will improve your overall health. It can help you control your weight, reduce stress, and improve your bone density.  Do not exercise so much that you hurt yourself, feel dizzy, or get very short of breath.  Before you start a new exercise program, talk with your health care provider. This information is not intended to replace advice given to you by your health care provider. Make sure you discuss any questions you have with your health care provider. Document Revised: 07/31/2017 Document Reviewed: 07/09/2017 Elsevier Patient Education  2021 Elsevier Inc.  

## 2020-09-27 LAB — CBC WITH DIFFERENTIAL/PLATELET
Basophils Absolute: 0.1 10*3/uL (ref 0.0–0.2)
Basos: 1 %
EOS (ABSOLUTE): 0.1 10*3/uL (ref 0.0–0.4)
Eos: 1 %
Hematocrit: 42.4 % (ref 34.0–46.6)
Hemoglobin: 13.8 g/dL (ref 11.1–15.9)
Immature Grans (Abs): 0 10*3/uL (ref 0.0–0.1)
Immature Granulocytes: 0 %
Lymphocytes Absolute: 1.4 10*3/uL (ref 0.7–3.1)
Lymphs: 21 %
MCH: 28.6 pg (ref 26.6–33.0)
MCHC: 32.5 g/dL (ref 31.5–35.7)
MCV: 88 fL (ref 79–97)
Monocytes Absolute: 0.5 10*3/uL (ref 0.1–0.9)
Monocytes: 8 %
Neutrophils Absolute: 4.8 10*3/uL (ref 1.4–7.0)
Neutrophils: 69 %
Platelets: 315 10*3/uL (ref 150–450)
RBC: 4.82 x10E6/uL (ref 3.77–5.28)
RDW: 14 % (ref 11.7–15.4)
WBC: 6.8 10*3/uL (ref 3.4–10.8)

## 2020-09-27 LAB — CMP14+EGFR
ALT: 34 IU/L — ABNORMAL HIGH (ref 0–32)
AST: 28 IU/L (ref 0–40)
Albumin/Globulin Ratio: 1.7 (ref 1.2–2.2)
Albumin: 4.7 g/dL (ref 3.8–4.9)
Alkaline Phosphatase: 120 IU/L (ref 44–121)
BUN/Creatinine Ratio: 11 (ref 9–23)
BUN: 8 mg/dL (ref 6–24)
Bilirubin Total: 0.5 mg/dL (ref 0.0–1.2)
CO2: 24 mmol/L (ref 20–29)
Calcium: 8.7 mg/dL (ref 8.7–10.2)
Chloride: 101 mmol/L (ref 96–106)
Creatinine, Ser: 0.74 mg/dL (ref 0.57–1.00)
GFR calc Af Amer: 108 mL/min/{1.73_m2} (ref >59)
GFR calc non Af Amer: 94 mL/min/{1.73_m2} (ref >59)
Globulin, Total: 2.8 g/dL (ref 1.5–4.5)
Glucose: 91 mg/dL (ref 65–99)
Potassium: 3.7 mmol/L (ref 3.5–5.2)
Sodium: 142 mmol/L (ref 134–144)
Total Protein: 7.5 g/dL (ref 6.0–8.5)

## 2020-09-27 LAB — LIPID PANEL
Chol/HDL Ratio: 3.3 ratio (ref 0.0–4.4)
Cholesterol, Total: 107 mg/dL (ref 100–199)
HDL: 32 mg/dL — ABNORMAL LOW (ref >39)
LDL Chol Calc (NIH): 57 mg/dL (ref 0–99)
Triglycerides: 92 mg/dL (ref 0–149)
VLDL Cholesterol Cal: 18 mg/dL (ref 5–40)

## 2020-09-27 LAB — THYROID PANEL WITH TSH
Free Thyroxine Index: 1.2 (ref 1.2–4.9)
T3 Uptake Ratio: 20 % — ABNORMAL LOW (ref 24–39)
T4, Total: 5.9 ug/dL (ref 4.5–12.0)
TSH: 3.99 u[IU]/mL (ref 0.450–4.500)

## 2020-10-01 LAB — CYTOLOGY - PAP
Adequacy: ABSENT
Comment: NEGATIVE
Diagnosis: NEGATIVE
High risk HPV: NEGATIVE

## 2020-10-19 DIAGNOSIS — Z1212 Encounter for screening for malignant neoplasm of rectum: Secondary | ICD-10-CM | POA: Diagnosis not present

## 2020-10-27 LAB — EXTERNAL GENERIC LAB PROCEDURE: COLOGUARD: NEGATIVE

## 2020-10-27 LAB — COLOGUARD: COLOGUARD: NEGATIVE

## 2020-11-02 LAB — COLOGUARD: Cologuard: NEGATIVE

## 2021-01-30 DIAGNOSIS — N390 Urinary tract infection, site not specified: Secondary | ICD-10-CM | POA: Diagnosis not present

## 2021-01-31 DIAGNOSIS — N390 Urinary tract infection, site not specified: Secondary | ICD-10-CM | POA: Diagnosis not present

## 2021-03-28 ENCOUNTER — Ambulatory Visit: Payer: BC Managed Care – PPO | Admitting: Nurse Practitioner

## 2021-03-28 ENCOUNTER — Encounter: Payer: Self-pay | Admitting: Nurse Practitioner

## 2021-03-28 ENCOUNTER — Other Ambulatory Visit: Payer: Self-pay

## 2021-03-28 VITALS — BP 120/81 | HR 74 | Temp 96.7°F | Resp 20 | Ht 60.0 in | Wt 181.0 lb

## 2021-03-28 DIAGNOSIS — E785 Hyperlipidemia, unspecified: Secondary | ICD-10-CM | POA: Diagnosis not present

## 2021-03-28 DIAGNOSIS — I10 Essential (primary) hypertension: Secondary | ICD-10-CM

## 2021-03-28 MED ORDER — ROSUVASTATIN CALCIUM 20 MG PO TABS: 20.0000 mg | ORAL_TABLET | Freq: Every day | ORAL | 1 refills | Status: DC

## 2021-03-28 MED ORDER — LISINOPRIL 20 MG PO TABS: ORAL_TABLET | ORAL | 1 refills | Status: DC

## 2021-03-28 NOTE — Progress Notes (Signed)
Subjective:    Patient ID: Grace Rodriguez, female    DOB: Mar 09, 1971, 50 y.o.   MRN: [id number]   Chief Complaint: medical management of chronic issues      HPI:  1. Primary hypertension No c/o chest pain, sob or headache. Does not check blood pressure at home. BP Readings from Last 3 Encounters:  09/26/20 126/85  03/26/20 122/79  09/27/19 139/86     2. Hyperlipidemia with target LDL less than 100 Does not watch diet and does no dedicated exercise. Lab Results  Component Value Date   CHOL 107 09/26/2020   HDL 32 (L) 09/26/2020   LDLCALC 57 09/26/2020   TRIG 92 09/26/2020   CHOLHDL 3.3 09/26/2020     3. BMI 33.0-33.9,adult Weight is up 12 lbs from last visit Wt Readings from Last 3 Encounters:  03/28/21 181 lb (82.1 kg)  09/26/20 169 lb (76.7 kg)  03/26/20 188 lb (85.3 kg)   BMI Readings from Last 3 Encounters:  03/28/21 35.35 kg/m  09/26/20 33.01 kg/m  03/26/20 36.72 kg/m      Outpatient Encounter Medications as of 03/28/2021  Medication Sig   lisinopril (ZESTRIL) 20 MG tablet TAKE ONE (1) TABLET EACH DAY   rosuvastatin (CRESTOR) 20 MG tablet Take 1 tablet (20 mg total) by mouth daily.   vitamin C (ASCORBIC ACID) 500 MG tablet Take 500 mg by mouth daily.   No facility-administered encounter medications on file as of 03/28/2021.    Past Surgical History:  Procedure Laterality Date   CESAREAN SECTION      Family History  Problem Relation Age of Onset   Diabetes Mother    Hyperlipidemia Mother    Hypertension Mother    Cancer Maternal Aunt        Breast 2017    New complaints: None today  Social history: Lives with her husband  Controlled substance contract: n/a     Review of Systems  Constitutional:  Negative for diaphoresis.  Eyes:  Negative for pain.  Respiratory:  Negative for shortness of breath.   Cardiovascular:  Negative for chest pain, palpitations and leg swelling.  Gastrointestinal:  Negative for abdominal pain.   Endocrine: Negative for polydipsia.  Skin:  Negative for rash.  Neurological:  Negative for dizziness, weakness and headaches.  Hematological:  Does not bruise/bleed easily.  All other systems reviewed and are negative.     Objective:   Physical Exam Vitals and nursing note reviewed.  Constitutional:      General: She is not in acute distress.    Appearance: Normal appearance. She is well-developed.  HENT:     Head: Normocephalic.     Right Ear: Tympanic membrane normal.     Left Ear: Tympanic membrane normal.     Nose: Nose normal.     Mouth/Throat:     Mouth: Mucous membranes are moist.  Eyes:     Pupils: Pupils are equal, round, and reactive to light.  Neck:     Vascular: No carotid bruit or JVD.  Cardiovascular:     Rate and Rhythm: Normal rate and regular rhythm.     Heart sounds: Normal heart sounds.  Pulmonary:     Effort: Pulmonary effort is normal. No respiratory distress.     Breath sounds: Normal breath sounds. No wheezing or rales.  Chest:     Chest wall: No tenderness.  Abdominal:     General: Bowel sounds are normal. There is no distension or abdominal bruit.  Palpations: Abdomen is soft. There is no hepatomegaly, splenomegaly, mass or pulsatile mass.     Tenderness: There is no abdominal tenderness.  Musculoskeletal:        General: Normal range of motion.     Cervical back: Normal range of motion and neck supple.  Lymphadenopathy:     Cervical: No cervical adenopathy.  Skin:    General: Skin is warm and dry.  Neurological:     Mental Status: She is alert and oriented to person, place, and time.     Deep Tendon Reflexes: Reflexes are normal and symmetric.  Psychiatric:        Behavior: Behavior normal.        Thought Content: Thought content normal.        Judgment: Judgment normal.    BP 120/81   Pulse 74   Temp (!) 96.7 F (35.9 C) (Temporal)   Resp 20   Ht 5' (1.524 m)   Wt 181 lb (82.1 kg)   SpO2 99%   BMI 35.35 kg/m         Assessment & Plan:  [name] comes in today with chief complaint of Medical Management of Chronic Issues   Diagnosis and orders addressed:  1. Primary hypertension Low sodium diet - lisinopril (ZESTRIL) 20 MG tablet; TAKE ONE (1) TABLET EACH DAY  Dispense: 90 tablet; Refill: 1 - CBC with Differential/Platelet - CMP14+EGFR  2. Hyperlipidemia with target LDL less than 100 Low fat diet - rosuvastatin (CRESTOR) 20 MG tablet; Take 1 tablet (20 mg total) by mouth daily.  Dispense: 90 tablet; Refill: 1 - Lipid panel  3. Morbid obesity (HCC) Discussed diet and exercise for person with BMI >25 Will recheck weight in 3-6 months    Labs pending Health Maintenance reviewed Diet and exercise encouraged  Follow up plan: 6 months   Mary-Margaret Martin, FNP   

## 2021-03-29 LAB — CMP14+EGFR
ALT: 29 IU/L (ref 0–32)
AST: 39 IU/L (ref 0–40)
Albumin/Globulin Ratio: 1.8 (ref 1.2–2.2)
Albumin: 4.8 g/dL (ref 3.8–4.9)
Alkaline Phosphatase: 125 IU/L — ABNORMAL HIGH (ref 44–121)
BUN/Creatinine Ratio: 11 (ref 9–23)
BUN: 8 mg/dL (ref 6–24)
Bilirubin Total: 0.6 mg/dL (ref 0.0–1.2)
CO2: 25 mmol/L (ref 20–29)
Calcium: 9.7 mg/dL (ref 8.7–10.2)
Chloride: 101 mmol/L (ref 96–106)
Creatinine, Ser: 0.75 mg/dL (ref 0.57–1.00)
Globulin, Total: 2.7 g/dL (ref 1.5–4.5)
Glucose: 95 mg/dL (ref 65–99)
Potassium: 4.6 mmol/L (ref 3.5–5.2)
Sodium: 141 mmol/L (ref 134–144)
Total Protein: 7.5 g/dL (ref 6.0–8.5)
eGFR: 96 mL/min/{1.73_m2} (ref >59)

## 2021-03-29 LAB — CBC WITH DIFFERENTIAL/PLATELET
Basophils Absolute: 0.1 10*3/uL (ref 0.0–0.2)
Basos: 1 %
EOS (ABSOLUTE): 0.1 10*3/uL (ref 0.0–0.4)
Eos: 1 %
Hematocrit: 41.9 % (ref 34.0–46.6)
Hemoglobin: 13.6 g/dL (ref 11.1–15.9)
Immature Grans (Abs): 0.1 10*3/uL (ref 0.0–0.1)
Immature Granulocytes: 1 %
Lymphocytes Absolute: 2.5 10*3/uL (ref 0.7–3.1)
Lymphs: 25 %
MCH: 29.6 pg (ref 26.6–33.0)
MCHC: 32.5 g/dL (ref 31.5–35.7)
MCV: 91 fL (ref 79–97)
Monocytes Absolute: 0.6 10*3/uL (ref 0.1–0.9)
Monocytes: 6 %
Neutrophils Absolute: 6.5 10*3/uL (ref 1.4–7.0)
Neutrophils: 66 %
Platelets: 397 10*3/uL (ref 150–450)
RBC: 4.59 x10E6/uL (ref 3.77–5.28)
RDW: 13.7 % (ref 11.7–15.4)
WBC: 9.8 10*3/uL (ref 3.4–10.8)

## 2021-03-29 LAB — LIPID PANEL
Chol/HDL Ratio: 2.6 ratio (ref 0.0–4.4)
Cholesterol, Total: 128 mg/dL (ref 100–199)
HDL: 49 mg/dL (ref >39)
LDL Chol Calc (NIH): 61 mg/dL (ref 0–99)
Triglycerides: 96 mg/dL (ref 0–149)
VLDL Cholesterol Cal: 18 mg/dL (ref 5–40)

## 2021-07-08 ENCOUNTER — Encounter (HOSPITAL_COMMUNITY): Payer: Self-pay

## 2021-07-08 ENCOUNTER — Emergency Department (HOSPITAL_COMMUNITY)
Admission: EM | Admit: 2021-07-08 | Discharge: 2021-07-08 | Disposition: A | Discharge status: Home / Self Care | Payer: BC Managed Care – PPO | Attending: Emergency Medicine | Admitting: Emergency Medicine

## 2021-07-08 ENCOUNTER — Emergency Department (HOSPITAL_COMMUNITY): Payer: BC Managed Care – PPO

## 2021-07-08 ENCOUNTER — Other Ambulatory Visit: Payer: Self-pay

## 2021-07-08 DIAGNOSIS — Z79899 Other long term (current) drug therapy: Secondary | ICD-10-CM | POA: Diagnosis not present

## 2021-07-08 DIAGNOSIS — M549 Dorsalgia, unspecified: Secondary | ICD-10-CM | POA: Diagnosis not present

## 2021-07-08 DIAGNOSIS — M79652 Pain in left thigh: Secondary | ICD-10-CM | POA: Diagnosis not present

## 2021-07-08 DIAGNOSIS — M67853 Other specified disorders of tendon, right hip: Secondary | ICD-10-CM | POA: Diagnosis not present

## 2021-07-08 DIAGNOSIS — M545 Low back pain, unspecified: Secondary | ICD-10-CM | POA: Diagnosis not present

## 2021-07-08 DIAGNOSIS — M76892 Other specified enthesopathies of left lower limb, excluding foot: Secondary | ICD-10-CM

## 2021-07-08 DIAGNOSIS — M25552 Pain in left hip: Secondary | ICD-10-CM

## 2021-07-08 DIAGNOSIS — I10 Essential (primary) hypertension: Secondary | ICD-10-CM | POA: Insufficient documentation

## 2021-07-08 MED ORDER — IBUPROFEN 800 MG PO TABS: 800.0000 mg | ORAL_TABLET | Freq: Three times a day (TID) | ORAL | 0 refills | Status: AC

## 2021-07-08 MED ORDER — METHOCARBAMOL 500 MG PO TABS: 500.0000 mg | ORAL_TABLET | Freq: Three times a day (TID) | ORAL | 0 refills | Status: DC

## 2021-07-08 NOTE — Discharge Instructions (Signed)
You likely have a inflammatory process of your left hip.  You may alternate ice and heat to your hip area.  Avoid excessive walking or standing.  Follow-up with the orthopedic provider listed in 1 week if your symptoms are not improving.  You may also follow-up with your primary care provider.

## 2021-07-08 NOTE — ED Triage Notes (Signed)
Pt presents to ED with complaints of left leg pain since July. Pt states has gotten worse since July. Pt states she fell in July, but pain has gotten worse since.

## 2021-07-12 NOTE — ED Provider Notes (Signed)
[facility] EMERGENCY DEPARTMENT Provider Note   CSN: [id number] Arrival date & time: 07/08/21  0844     History Chief Complaint  Patient presents with   Leg Pain    Grace Rodriguez is a 50 y.o. female.   Leg Pain Associated symptoms: back pain   Associated symptoms: no fever       Grace Rodriguez is a 50 y.o. female who presents to the Emergency Department complaining of left leg pain for months.  She had a fall several months ago and reports pain to her left hip and into her left thigh.  Pain is worse with weight bearing and certain movements.  Pain gradually worsening.   No recent injury.  She denies urine or bowel changes, abdominal pain, fever, chills, numbness or weakness of the lower extremities.     Past Medical History:  Diagnosis Date   Hypertension     Patient Active Problem List   Diagnosis Date Noted   BMI 30.0-30.9,adult 01/11/2015   Hyperlipidemia with target LDL less than 100 01/09/2014   Hypertension 01/12/2013    Past Surgical History:  Procedure Laterality Date   CESAREAN SECTION       OB History   No obstetric history on file.     Family History  Problem Relation Age of Onset   Diabetes Mother    Hyperlipidemia Mother    Hypertension Mother    Cancer Maternal Aunt        Breast 2017    Social History   Tobacco Use   Smoking status: Never   Smokeless tobacco: Never  Vaping Use   Vaping Use: Never used  Substance Use Topics   Alcohol use: No   Drug use: No    Home Medications Prior to Admission medications   Medication Sig Start Date End Date Taking? Authorizing Provider  ibuprofen (ADVIL) 800 MG tablet Take 1 tablet (800 mg total) by mouth 3 (three) times daily. Take with food 07/08/21  Yes Kindel Rochefort, PA-C  methocarbamol (ROBAXIN) 500 MG tablet Take 1 tablet (500 mg total) by mouth 3 (three) times daily. 07/08/21  Yes Cainan Trull, PA-C  lisinopril (ZESTRIL) 20 MG tablet TAKE ONE (1) TABLET EACH DAY 03/28/21   Daphine Deutscher,  Mary-Margaret, FNP  rosuvastatin (CRESTOR) 20 MG tablet Take 1 tablet (20 mg total) by mouth daily. 03/28/21   Daphine Deutscher Mary-Margaret, FNP  vitamin C (ASCORBIC ACID) 500 MG tablet Take 500 mg by mouth daily.    [provider]    Allergies    Sulfa antibiotics  Review of Systems   Review of Systems  Constitutional:  Negative for appetite change, chills and fever.  Respiratory:  Negative for shortness of breath.   Cardiovascular:  Negative for chest pain.  Gastrointestinal:  Negative for abdominal pain, constipation, nausea and vomiting.  Genitourinary:  Negative for decreased urine volume, difficulty urinating, dysuria, flank pain and hematuria.  Musculoskeletal:  Positive for back pain. Negative for joint swelling.  Skin:  Negative for rash.  Neurological:  Negative for weakness and numbness.  All other systems reviewed and are negative.  Physical Exam Updated Vital Signs BP 140/78   Pulse 88   Temp 97.8 F (36.6 C) (Oral)   Resp 18   Ht 5' (1.524 m)   Wt 82.1 kg   LMP 06/24/2021   SpO2 98%   BMI 35.35 kg/m   Physical Exam Vitals and nursing note reviewed.  Constitutional:      Appearance: Normal appearance. She  is not ill-appearing or toxic-appearing.  HENT:     Head: Atraumatic.  Cardiovascular:     Rate and Rhythm: Normal rate and regular rhythm.     Pulses: Normal pulses.  Pulmonary:     Effort: Pulmonary effort is normal.     Breath sounds: Normal breath sounds.  Abdominal:     Palpations: Abdomen is soft.     Tenderness: There is no abdominal tenderness. There is no right CVA tenderness, left CVA tenderness, guarding or rebound.  Musculoskeletal:        General: No tenderness. Normal range of motion.     Comments: Ttp of the left lateral hip region.  Pain with SLR and external rotation of the hip.    Skin:    General: Skin is warm and dry.     Capillary Refill: Capillary refill takes less than 2 seconds.  Neurological:     General: No focal  deficit present.     Mental Status: She is alert.     Sensory: No sensory deficit.     Motor: No weakness.  Psychiatric:        Judgment: Judgment normal.    ED Results / Procedures / Treatments   Labs (all labs ordered are listed, but only abnormal results are displayed) Labs Reviewed - No data to display  EKG None  Radiology No results found.  Procedures Procedures   Medications Ordered in ED Medications - No data to display  ED Course  I have reviewed the triage vital signs and the nursing notes.  Pertinent labs & imaging results that were available during my care of the patient were reviewed by me and considered in my medical decision making (see chart for details).    MDM Rules/Calculators/A&P                           Pt here with gradually worsening hip pain for several months.  She is ambulatory with steady gait.  No focal neuro deficits.  No concerning sx's for septic joint.    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   Discussed findings with pt.  She is agreeable to close orthopedic f/u, NSAID therapy. Return precautions discussed.    Final Clinical Impression(s) / ED Diagnoses Final diagnoses:  Hip pain, acute, left  Tendonitis of left hip    Rx / DC Orders ED Discharge Orders          Ordered    ibuprofen (ADVIL) 800 MG tablet  3 times daily        07/08/21 1409    methocarbamol (ROBAXIN) 500 MG tablet  3 times daily        07/08/21 Scammon, Travas Schexnayder, PA-C 07/12/21 2254    Milton Ferguson, MD 07/19/21 [phone]

## 2021-09-24 ENCOUNTER — Other Ambulatory Visit (HOSPITAL_COMMUNITY): Payer: Self-pay | Admitting: Nurse Practitioner

## 2021-09-24 DIAGNOSIS — Z1231 Encounter for screening mammogram for malignant neoplasm of breast: Secondary | ICD-10-CM

## 2021-10-01 ENCOUNTER — Encounter: Payer: Self-pay | Admitting: Nurse Practitioner

## 2021-10-01 ENCOUNTER — Ambulatory Visit: Payer: BC Managed Care – PPO | Admitting: Nurse Practitioner

## 2021-10-01 VITALS — BP 122/74 | HR 78 | Temp 97.3°F | Resp 20 | Ht 60.0 in | Wt 189.0 lb

## 2021-10-01 DIAGNOSIS — E785 Hyperlipidemia, unspecified: Secondary | ICD-10-CM

## 2021-10-01 DIAGNOSIS — I10 Essential (primary) hypertension: Secondary | ICD-10-CM

## 2021-10-01 DIAGNOSIS — Z6830 Body mass index (BMI) 30.0-30.9, adult: Secondary | ICD-10-CM

## 2021-10-01 MED ORDER — LISINOPRIL 20 MG PO TABS: ORAL_TABLET | ORAL | 1 refills | Status: DC

## 2021-10-01 MED ORDER — ROSUVASTATIN CALCIUM 20 MG PO TABS: 20.0000 mg | ORAL_TABLET | Freq: Every day | ORAL | 1 refills | Status: DC

## 2021-10-01 NOTE — Progress Notes (Signed)
Subjective:    Patient ID: Grace Rodriguez, female    DOB: 1971-08-14, 51 y.o.   MRN: [id number]   Chief Complaint: medical management of chronic issues     HPI:  Grace Rodriguez is a 51 y.o. who identifies as a female who was assigned female at birth.   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 Primary hypertension No c/o chest pain, sob or headache. Does not check blood pressure at home. BP Readings from Last 3 Encounters:  07/08/21 140/78  03/28/21 120/81  09/26/20 126/85    2. Hyperlipidemia with target LDL less than 100 Does not watch diet and does no dedicated exercise. Lab Results  Component Value Date   CHOL 128 03/28/2021   HDL 49 03/28/2021   LDLCALC 61 03/28/2021   TRIG 96 03/28/2021   CHOLHDL 2.6 03/28/2021     3. BMI 35.0-35.9,adult Weight Is up 8lbs Wt Readings from Last 3 Encounters:  10/01/21 189 lb (85.7 kg)  07/08/21 181 lb (82.1 kg)  03/28/21 181 lb (82.1 kg)   BMI Readings from Last 3 Encounters:  10/01/21 36.91 kg/m  07/08/21 35.35 kg/m  03/28/21 35.35 kg/m      New complaints: None today  Allergies  Allergen Reactions   Sulfa Antibiotics Rash   Outpatient Encounter Medications as of 10/01/2021  Medication Sig   ibuprofen (ADVIL) 800 MG tablet Take 1 tablet (800 mg total) by mouth 3 (three) times daily. Take with food   lisinopril (ZESTRIL) 20 MG tablet TAKE ONE (1) TABLET EACH DAY   methocarbamol (ROBAXIN) 500 MG tablet Take 1 tablet (500 mg total) by mouth 3 (three) times daily.   rosuvastatin (CRESTOR) 20 MG tablet Take 1 tablet (20 mg total) by mouth daily.   vitamin C (ASCORBIC ACID) 500 MG tablet Take 500 mg by mouth daily.   No facility-administered encounter medications on file as of 10/01/2021.    Past Surgical History:  Procedure Laterality Date   CESAREAN SECTION      Family History  Problem Relation Age of  Onset   Diabetes Mother    Hyperlipidemia Mother    Hypertension Mother    Cancer Maternal Aunt        Breast 2017      Controlled substance contract: n/a     Review of Systems  Constitutional:  Negative for diaphoresis.  Eyes:  Negative for pain.  Respiratory:  Negative for shortness of breath.   Cardiovascular:  Negative for chest pain, palpitations and leg swelling.  Gastrointestinal:  Negative for abdominal pain.  Endocrine: Negative for polydipsia.  Skin:  Negative for rash.  Neurological:  Negative for dizziness, weakness and headaches.  Hematological:  Does not bruise/bleed easily.  All other systems reviewed and are negative.     Objective:   Physical Exam Vitals and nursing note reviewed.  Constitutional:      General: She is not in acute distress.    Appearance: Normal appearance. She is well-developed.  HENT:     Head: Normocephalic.     Right Ear: Tympanic membrane normal.     Left Ear: Tympanic membrane normal.     Nose: Nose normal.     Mouth/Throat:     Mouth: Mucous membranes are moist.  Eyes:     Pupils: Pupils are equal, round, and reactive to light.  Neck:  Vascular: No carotid bruit or JVD.  Cardiovascular:     Rate and Rhythm: Normal rate and regular rhythm.     Heart sounds: Normal heart sounds.  Pulmonary:     Effort: Pulmonary effort is normal. No respiratory distress.     Breath sounds: Normal breath sounds. No wheezing or rales.  Chest:     Chest wall: No tenderness.  Abdominal:     General: Bowel sounds are normal. There is no distension or abdominal bruit.     Palpations: Abdomen is soft. There is no hepatomegaly, splenomegaly, mass or pulsatile mass.     Tenderness: There is no abdominal tenderness.  Musculoskeletal:        General: Normal range of motion.     Cervical back: Normal range of motion and neck supple.  Lymphadenopathy:     Cervical: No cervical adenopathy.  Skin:    General: Skin is warm and dry.   Neurological:     Mental Status: She is alert and oriented to person, place, and time.     Deep Tendon Reflexes: Reflexes are normal and symmetric.  Psychiatric:        Behavior: Behavior normal.        Thought Content: Thought content normal.        Judgment: Judgment normal.   BP 122/74    Pulse 78    Temp (!) 97.3 F (36.3 C) (Temporal)    Resp 20    Ht 5' (1.524 m)    Wt 189 lb (85.7 kg)    SpO2 97%    BMI 36.91 kg/m         Assessment & Plan:  Grace Rodriguez comes in today with chief complaint of Medical Management of Chronic Issues   Diagnosis and orders addressed:  1. Primary hypertension Low sodium diet - CBC with Differential/Platelet - CMP14+EGFR - lisinopril (ZESTRIL) 20 MG tablet; TAKE ONE (1) TABLET EACH DAY  Dispense: 90 tablet; Refill: 1  2. Hyperlipidemia with target LDL less than 100 Low fat diet - Lipid panel - rosuvastatin (CRESTOR) 20 MG tablet; Take 1 tablet (20 mg total) by mouth daily.  Dispense: 90 tablet; Refill: 1  3. BMI 30.0-30.9,adult Discussed diet and exercise for person with BMI >25 Will recheck weight in 3-6 months    Labs pending Health Maintenance reviewed Diet and exercise encouraged  Follow up plan: 6 months   Mary-Margaret Hassell Done, FNP

## 2021-10-01 NOTE — Patient Instructions (Signed)

## 2021-10-02 LAB — CBC WITH DIFFERENTIAL/PLATELET
Basophils Absolute: 0.1 10*3/uL (ref 0.0–0.2)
Basos: 1 %
EOS (ABSOLUTE): 0.2 10*3/uL (ref 0.0–0.4)
Eos: 2 %
Hematocrit: 39.3 % (ref 34.0–46.6)
Hemoglobin: 13.1 g/dL (ref 11.1–15.9)
Immature Grans (Abs): 0 10*3/uL (ref 0.0–0.1)
Immature Granulocytes: 0 %
Lymphocytes Absolute: 2.7 10*3/uL (ref 0.7–3.1)
Lymphs: 29 %
MCH: 30.1 pg (ref 26.6–33.0)
MCHC: 33.3 g/dL (ref 31.5–35.7)
MCV: 90 fL (ref 79–97)
Monocytes Absolute: 0.5 10*3/uL (ref 0.1–0.9)
Monocytes: 6 %
Neutrophils Absolute: 5.6 10*3/uL (ref 1.4–7.0)
Neutrophils: 62 %
Platelets: 372 10*3/uL (ref 150–450)
RBC: 4.35 x10E6/uL (ref 3.77–5.28)
RDW: 13.6 % (ref 11.7–15.4)
WBC: 9.1 10*3/uL (ref 3.4–10.8)

## 2021-10-02 LAB — CMP14+EGFR
ALT: 35 IU/L — ABNORMAL HIGH (ref 0–32)
AST: 41 IU/L — ABNORMAL HIGH (ref 0–40)
Albumin/Globulin Ratio: 1.6 (ref 1.2–2.2)
Albumin: 4.5 g/dL (ref 3.8–4.9)
Alkaline Phosphatase: 111 IU/L (ref 44–121)
BUN/Creatinine Ratio: 18 (ref 9–23)
BUN: 14 mg/dL (ref 6–24)
Bilirubin Total: 0.4 mg/dL (ref 0.0–1.2)
CO2: 22 mmol/L (ref 20–29)
Calcium: 9.4 mg/dL (ref 8.7–10.2)
Chloride: 101 mmol/L (ref 96–106)
Creatinine, Ser: 0.76 mg/dL (ref 0.57–1.00)
Globulin, Total: 2.9 g/dL (ref 1.5–4.5)
Glucose: 106 mg/dL — ABNORMAL HIGH (ref 70–99)
Potassium: 3.9 mmol/L (ref 3.5–5.2)
Sodium: 145 mmol/L — ABNORMAL HIGH (ref 134–144)
Total Protein: 7.4 g/dL (ref 6.0–8.5)
eGFR: 94 mL/min/{1.73_m2} (ref >59)

## 2021-10-02 LAB — LIPID PANEL
Chol/HDL Ratio: 2.6 ratio (ref 0.0–4.4)
Cholesterol, Total: 127 mg/dL (ref 100–199)
HDL: 48 mg/dL (ref >39)
LDL Chol Calc (NIH): 62 mg/dL (ref 0–99)
Triglycerides: 88 mg/dL (ref 0–149)
VLDL Cholesterol Cal: 17 mg/dL (ref 5–40)

## 2021-10-04 ENCOUNTER — Other Ambulatory Visit: Payer: Self-pay

## 2021-10-04 ENCOUNTER — Ambulatory Visit (HOSPITAL_COMMUNITY)
Admission: RE | Admit: 2021-10-04 | Discharge: 2021-10-04 | Disposition: A | Discharge status: Home / Self Care | Payer: 59 | Source: Ambulatory Visit | Attending: Nurse Practitioner | Admitting: Nurse Practitioner

## 2021-10-04 DIAGNOSIS — Z1231 Encounter for screening mammogram for malignant neoplasm of breast: Secondary | ICD-10-CM | POA: Insufficient documentation

## 2022-03-31 ENCOUNTER — Ambulatory Visit: Payer: Managed Care, Other (non HMO) | Admitting: Nurse Practitioner

## 2022-03-31 ENCOUNTER — Encounter: Payer: Self-pay | Admitting: Nurse Practitioner

## 2022-03-31 VITALS — BP 136/83 | HR 76 | Temp 97.6°F | Resp 20 | Ht 60.0 in | Wt 190.0 lb

## 2022-03-31 DIAGNOSIS — I10 Essential (primary) hypertension: Secondary | ICD-10-CM | POA: Diagnosis not present

## 2022-03-31 DIAGNOSIS — E785 Hyperlipidemia, unspecified: Secondary | ICD-10-CM | POA: Diagnosis not present

## 2022-03-31 DIAGNOSIS — Z6830 Body mass index (BMI) 30.0-30.9, adult: Secondary | ICD-10-CM

## 2022-03-31 MED ORDER — ROSUVASTATIN CALCIUM 20 MG PO TABS: 20.0000 mg | ORAL_TABLET | Freq: Every day | ORAL | 1 refills | Status: DC

## 2022-03-31 MED ORDER — LISINOPRIL 20 MG PO TABS: ORAL_TABLET | ORAL | 1 refills | Status: DC

## 2022-03-31 NOTE — Patient Instructions (Signed)

## 2022-03-31 NOTE — Progress Notes (Signed)
Subjective:    Patient ID: Grace Rodriguez, female    DOB: 1971-02-05, 51 y.o.   MRN: [id number]   Chief Complaint: medical management of chronic issues     HPI:  Grace Rodriguez is a 51 y.o. who identifies as a female who was assigned female at birth.   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 Primary hypertension No c/o chest pain, sob or headache. Does not check blood pressure at home. BP Readings from Last 3 Encounters:  10/01/21 122/74  07/08/21 140/78  03/28/21 120/81     2. Hyperlipidemia with target LDL less than 100 Does try to watch diet but does little to no exercise. Lab Results  Component Value Date   CHOL 127 10/01/2021   HDL 48 10/01/2021   LDLCALC 62 10/01/2021   TRIG 88 10/01/2021   CHOLHDL 2.6 10/01/2021   The ASCVD Risk score (Arnett DK, et al., 2019) failed to calculate for the following reasons:   The valid total cholesterol range is 130 to 320 mg/dL   3. BMI 30.0-30.9,adult No recent weight changes Wt Readings from Last 3 Encounters:  03/31/22 190 lb (86.2 kg)  10/01/21 189 lb (85.7 kg)  07/08/21 181 lb (82.1 kg)   BMI Readings from Last 3 Encounters:  03/31/22 37.11 kg/m  10/01/21 36.91 kg/m  07/08/21 35.35 kg/m     New complaints: None today  Allergies  Allergen Reactions   Sulfa Antibiotics Rash   Outpatient Encounter Medications as of 03/31/2022  Medication Sig   ibuprofen (ADVIL) 800 MG tablet Take 1 tablet (800 mg total) by mouth 3 (three) times daily. Take with food   lisinopril (ZESTRIL) 20 MG tablet TAKE ONE (1) TABLET EACH DAY   rosuvastatin (CRESTOR) 20 MG tablet Take 1 tablet (20 mg total) by mouth daily.   vitamin C (ASCORBIC ACID) 500 MG tablet Take 500 mg by mouth daily.   No facility-administered encounter medications on file as of 03/31/2022.    Past Surgical History:  Procedure Laterality Date    CESAREAN SECTION      Family History  Problem Relation Age of Onset   Diabetes Mother    Hyperlipidemia Mother    Hypertension Mother    Cancer Maternal Aunt        Breast 2017      Controlled substance contract: n/a     Review of Systems  Constitutional:  Negative for diaphoresis.  Eyes:  Negative for pain.  Respiratory:  Negative for shortness of breath.   Cardiovascular:  Negative for chest pain, palpitations and leg swelling.  Gastrointestinal:  Negative for abdominal pain.  Endocrine: Negative for polydipsia.  Skin:  Negative for rash.  Neurological:  Negative for dizziness, weakness and headaches.  Hematological:  Does not bruise/bleed easily.  All other systems reviewed and are negative.      Objective:   Physical Exam Vitals and nursing note reviewed.  Constitutional:      General: She is not in acute distress.    Appearance: Normal appearance. She is well-developed.  HENT:     Head: Normocephalic.     Right Ear: Tympanic membrane normal.     Left Ear: Tympanic membrane normal.     Nose: Nose normal.     Mouth/Throat:     Mouth: Mucous membranes are moist.  Eyes:     Pupils: Pupils  are equal, round, and reactive to light.  Neck:     Vascular: No carotid bruit or JVD.  Cardiovascular:     Rate and Rhythm: Normal rate and regular rhythm.     Heart sounds: Normal heart sounds.  Pulmonary:     Effort: Pulmonary effort is normal. No respiratory distress.     Breath sounds: Normal breath sounds. No wheezing or rales.  Chest:     Chest wall: No tenderness.  Abdominal:     General: Bowel sounds are normal. There is no distension or abdominal bruit.     Palpations: Abdomen is soft. There is no hepatomegaly, splenomegaly, mass or pulsatile mass.     Tenderness: There is no abdominal tenderness.  Musculoskeletal:        General: Normal range of motion.     Cervical back: Normal range of motion and neck supple.  Lymphadenopathy:     Cervical: No cervical  adenopathy.  Skin:    General: Skin is warm and dry.  Neurological:     Mental Status: She is alert and oriented to person, place, and time.     Deep Tendon Reflexes: Reflexes are normal and symmetric.  Psychiatric:        Behavior: Behavior normal.        Thought Content: Thought content normal.        Judgment: Judgment normal.    BP 136/83   Pulse 76   Temp 97.6 F (36.4 C) (Temporal)   Resp 20   Ht 5' (1.524 m)   Wt 190 lb (86.2 kg)   SpO2 98%   BMI 37.11 kg/m         Assessment & Plan:   Grace Rodriguez comes in today with chief complaint of Medical Management of Chronic Issues (Finger pain. Left index finger)   Diagnosis and orders addressed:  1. Primary hypertension Low sodium diet - lisinopril (ZESTRIL) 20 MG tablet; TAKE ONE (1) TABLET EACH DAY  Dispense: 90 tablet; Refill: 1 - CBC with Differential/Platelet - CMP14+EGFR  2. Hyperlipidemia with target LDL less than 100 Low fat diet - rosuvastatin (CRESTOR) 20 MG tablet; Take 1 tablet (20 mg total) by mouth daily.  Dispense: 90 tablet; Refill: 1 - Lipid panel  3. BMI 30.0-30.9,adult Discussed diet and exercise for person with BMI >25 Will recheck weight in 3-6 months    Labs pending Health Maintenance reviewed Diet and exercise encouraged  Follow up plan: 6 months   Mary-Margaret Hassell Done, FNP

## 2022-04-01 LAB — CBC WITH DIFFERENTIAL/PLATELET
Basophils Absolute: 0.1 10*3/uL (ref 0.0–0.2)
Basos: 1 %
EOS (ABSOLUTE): 0.2 10*3/uL (ref 0.0–0.4)
Eos: 2 %
Hematocrit: 42.8 % (ref 34.0–46.6)
Hemoglobin: 13.7 g/dL (ref 11.1–15.9)
Immature Grans (Abs): 0 10*3/uL (ref 0.0–0.1)
Immature Granulocytes: 0 %
Lymphocytes Absolute: 2.8 10*3/uL (ref 0.7–3.1)
Lymphs: 27 %
MCH: 29.5 pg (ref 26.6–33.0)
MCHC: 32 g/dL (ref 31.5–35.7)
MCV: 92 fL (ref 79–97)
Monocytes Absolute: 0.6 10*3/uL (ref 0.1–0.9)
Monocytes: 6 %
Neutrophils Absolute: 6.7 10*3/uL (ref 1.4–7.0)
Neutrophils: 64 %
Platelets: 386 10*3/uL (ref 150–450)
RBC: 4.64 x10E6/uL (ref 3.77–5.28)
RDW: 13.9 % (ref 11.7–15.4)
WBC: 10.4 10*3/uL (ref 3.4–10.8)

## 2022-04-01 LAB — CMP14+EGFR
ALT: 57 IU/L — ABNORMAL HIGH (ref 0–32)
AST: 81 IU/L — ABNORMAL HIGH (ref 0–40)
Albumin/Globulin Ratio: 1.6 (ref 1.2–2.2)
Albumin: 4.6 g/dL (ref 3.8–4.9)
Alkaline Phosphatase: 117 IU/L (ref 44–121)
BUN/Creatinine Ratio: 19 (ref 9–23)
BUN: 14 mg/dL (ref 6–24)
Bilirubin Total: 0.4 mg/dL (ref 0.0–1.2)
CO2: 24 mmol/L (ref 20–29)
Calcium: 10 mg/dL (ref 8.7–10.2)
Chloride: 99 mmol/L (ref 96–106)
Creatinine, Ser: 0.74 mg/dL (ref 0.57–1.00)
Globulin, Total: 2.9 g/dL (ref 1.5–4.5)
Glucose: 124 mg/dL — ABNORMAL HIGH (ref 70–99)
Potassium: 4.7 mmol/L (ref 3.5–5.2)
Sodium: 141 mmol/L (ref 134–144)
Total Protein: 7.5 g/dL (ref 6.0–8.5)
eGFR: 97 mL/min/{1.73_m2} (ref >59)

## 2022-04-01 LAB — LIPID PANEL
Chol/HDL Ratio: 3 ratio (ref 0.0–4.4)
Cholesterol, Total: 140 mg/dL (ref 100–199)
HDL: 47 mg/dL (ref >39)
LDL Chol Calc (NIH): 75 mg/dL (ref 0–99)
Triglycerides: 98 mg/dL (ref 0–149)
VLDL Cholesterol Cal: 18 mg/dL (ref 5–40)

## 2022-09-22 ENCOUNTER — Other Ambulatory Visit (HOSPITAL_COMMUNITY): Payer: Self-pay | Admitting: Nurse Practitioner

## 2022-09-22 DIAGNOSIS — Z1231 Encounter for screening mammogram for malignant neoplasm of breast: Secondary | ICD-10-CM

## 2022-09-30 ENCOUNTER — Encounter: Payer: Self-pay | Admitting: Nurse Practitioner

## 2022-09-30 ENCOUNTER — Ambulatory Visit: Payer: Managed Care, Other (non HMO) | Admitting: Nurse Practitioner

## 2022-09-30 VITALS — BP 125/81 | HR 79 | Temp 97.5°F | Resp 20 | Ht 60.0 in | Wt 188.0 lb

## 2022-09-30 DIAGNOSIS — E785 Hyperlipidemia, unspecified: Secondary | ICD-10-CM | POA: Diagnosis not present

## 2022-09-30 DIAGNOSIS — Z6830 Body mass index (BMI) 30.0-30.9, adult: Secondary | ICD-10-CM

## 2022-09-30 DIAGNOSIS — I10 Essential (primary) hypertension: Secondary | ICD-10-CM

## 2022-09-30 LAB — CBC WITH DIFFERENTIAL/PLATELET
Basophils Absolute: 0.1 10*3/uL (ref 0.0–0.2)
Basos: 1 %
EOS (ABSOLUTE): 0.2 10*3/uL (ref 0.0–0.4)
Eos: 2 %
Hematocrit: 41.4 % (ref 34.0–46.6)
Hemoglobin: 13.5 g/dL (ref 11.1–15.9)
Immature Grans (Abs): 0 10*3/uL (ref 0.0–0.1)
Immature Granulocytes: 0 %
Lymphocytes Absolute: 2.7 10*3/uL (ref 0.7–3.1)
Lymphs: 31 %
MCH: 30.2 pg (ref 26.6–33.0)
MCHC: 32.6 g/dL (ref 31.5–35.7)
MCV: 93 fL (ref 79–97)
Monocytes Absolute: 0.5 10*3/uL (ref 0.1–0.9)
Monocytes: 6 %
Neutrophils Absolute: 5.2 10*3/uL (ref 1.4–7.0)
Neutrophils: 60 %
Platelets: 362 10*3/uL (ref 150–450)
RBC: 4.47 x10E6/uL (ref 3.77–5.28)
RDW: 13.7 % (ref 11.7–15.4)
WBC: 8.7 10*3/uL (ref 3.4–10.8)

## 2022-09-30 LAB — CMP14+EGFR
ALT: 57 IU/L — ABNORMAL HIGH (ref 0–32)
AST: 83 IU/L — ABNORMAL HIGH (ref 0–40)
Albumin/Globulin Ratio: 1.8 (ref 1.2–2.2)
Albumin: 4.6 g/dL (ref 3.8–4.9)
Alkaline Phosphatase: 120 IU/L (ref 44–121)
BUN/Creatinine Ratio: 18 (ref 9–23)
BUN: 13 mg/dL (ref 6–24)
Bilirubin Total: 0.3 mg/dL (ref 0.0–1.2)
CO2: 23 mmol/L (ref 20–29)
Calcium: 9.9 mg/dL (ref 8.7–10.2)
Chloride: 103 mmol/L (ref 96–106)
Creatinine, Ser: 0.73 mg/dL (ref 0.57–1.00)
Globulin, Total: 2.6 g/dL (ref 1.5–4.5)
Glucose: 124 mg/dL — ABNORMAL HIGH (ref 70–99)
Potassium: 4.9 mmol/L (ref 3.5–5.2)
Sodium: 143 mmol/L (ref 134–144)
Total Protein: 7.2 g/dL (ref 6.0–8.5)
eGFR: 98 mL/min/{1.73_m2} (ref >59)

## 2022-09-30 LAB — LIPID PANEL
Chol/HDL Ratio: 2.8 ratio (ref 0.0–4.4)
Cholesterol, Total: 133 mg/dL (ref 100–199)
HDL: 47 mg/dL (ref >39)
LDL Chol Calc (NIH): 70 mg/dL (ref 0–99)
Triglycerides: 83 mg/dL (ref 0–149)
VLDL Cholesterol Cal: 16 mg/dL (ref 5–40)

## 2022-09-30 MED ORDER — ROSUVASTATIN CALCIUM 20 MG PO TABS: 20.0000 mg | ORAL_TABLET | Freq: Every day | ORAL | 1 refills | Status: DC

## 2022-09-30 MED ORDER — LISINOPRIL 20 MG PO TABS: ORAL_TABLET | ORAL | 1 refills | Status: DC

## 2022-09-30 NOTE — Patient Instructions (Signed)

## 2022-09-30 NOTE — Progress Notes (Signed)
Subjective:    Patient ID: Grace Rodriguez, female    DOB: 09-08-1970, 52 y.o.   MRN: [id number]   Chief Complaint: medical management of chronic issues     HPI:  Grace Rodriguez is a 52 y.o. who identifies as a female who was assigned female at birth.   Social history: Lives with: her husband Work history: is a stay at [employer] in today for follow up of the following chronic medical issues:  1. Primary hypertension No c/o chest pain, sob or headache. Does not check blood pressure at home. BP Readings from Last 3 Encounters:  03/31/22 136/83  10/01/21 122/74  07/08/21 140/78     2. Hyperlipidemia with target LDL less than 100 Does not really watch diet and does no dedicated exercise Lab Results  Component Value Date   CHOL 140 03/31/2022   HDL 47 03/31/2022   LDLCALC 75 03/31/2022   TRIG 98 03/31/2022   CHOLHDL 3.0 03/31/2022     3. BMI 30.0-30.9,adult No recent weight changes  Wt Readings from Last 3 Encounters:  09/30/22 188 lb (85.3 kg)  03/31/22 190 lb (86.2 kg)  10/01/21 189 lb (85.7 kg)   BMI Readings from Last 3 Encounters:  09/30/22 36.72 kg/m  03/31/22 37.11 kg/m  10/01/21 36.91 kg/m    New complaints: None today  Allergies  Allergen Reactions   Sulfa Antibiotics Rash   Outpatient Encounter Medications as of 09/30/2022  Medication Sig   ibuprofen (ADVIL) 800 MG tablet Take 1 tablet (800 mg total) by mouth 3 (three) times daily. Take with food   lisinopril (ZESTRIL) 20 MG tablet TAKE ONE (1) TABLET EACH DAY   rosuvastatin (CRESTOR) 20 MG tablet Take 1 tablet (20 mg total) by mouth daily.   vitamin C (ASCORBIC ACID) 500 MG tablet Take 500 mg by mouth daily.   No facility-administered encounter medications on file as of 09/30/2022.    Past Surgical History:  Procedure Laterality Date   CESAREAN SECTION      Family History  Problem Relation Age of Onset   Diabetes Mother    Hyperlipidemia Mother    Hypertension Mother     Cancer Maternal Aunt        Breast 2017      Controlled substance contract: n/a     Review of Systems  Constitutional:  Negative for diaphoresis.  Eyes:  Negative for pain.  Respiratory:  Negative for shortness of breath.   Cardiovascular:  Negative for chest pain, palpitations and leg swelling.  Gastrointestinal:  Negative for abdominal pain.  Endocrine: Negative for polydipsia.  Skin:  Negative for rash.  Neurological:  Negative for dizziness, weakness and headaches.  Hematological:  Does not bruise/bleed easily.  All other systems reviewed and are negative.      Objective:   Physical Exam Vitals and nursing note reviewed.  Constitutional:      General: She is not in acute distress.    Appearance: Normal appearance. She is well-developed.  HENT:     Head: Normocephalic.     Right Ear: Tympanic membrane normal.     Left Ear: Tympanic membrane normal.     Nose: Nose normal.     Mouth/Throat:     Mouth: Mucous membranes are moist.  Eyes:     Pupils: Pupils are equal, round, and reactive to light.  Neck:     Vascular: No carotid bruit or JVD.  Cardiovascular:     Rate and Rhythm:  Normal rate and regular rhythm.     Heart sounds: Normal heart sounds.  Pulmonary:     Effort: Pulmonary effort is normal. No respiratory distress.     Breath sounds: Normal breath sounds. No wheezing or rales.  Chest:     Chest wall: No tenderness.  Abdominal:     General: Bowel sounds are normal. There is no distension or abdominal bruit.     Palpations: Abdomen is soft. There is no hepatomegaly, splenomegaly, mass or pulsatile mass.     Tenderness: There is no abdominal tenderness.  Musculoskeletal:        General: Normal range of motion.     Cervical back: Normal range of motion and neck supple.  Lymphadenopathy:     Cervical: No cervical adenopathy.  Skin:    General: Skin is warm and dry.  Neurological:     Mental Status: She is alert and oriented to person, place, and time.      Deep Tendon Reflexes: Reflexes are normal and symmetric.  Psychiatric:        Behavior: Behavior normal.        Thought Content: Thought content normal.        Judgment: Judgment normal.     BP 125/81   Pulse 79   Temp (!) 97.5 F (36.4 C) (Temporal)   Resp 20   Ht 5' (1.524 m)   Wt 188 lb (85.3 kg)   SpO2 96%   BMI 36.72 kg/m        Assessment & Plan:  Grace Rodriguez in today with chief complaint of Medical Management of Chronic Issues   1. Primary hypertension Low sodium diet - lisinopril (ZESTRIL) 20 MG tablet; TAKE ONE (1) TABLET EACH DAY  Dispense: 90 tablet; Refill: 1 - CBC with Differential/Platelet - CMP14+EGFR  2. Hyperlipidemia with target LDL less than 100 Low fat diet - rosuvastatin (CRESTOR) 20 MG tablet; Take 1 tablet (20 mg total) by mouth daily.  Dispense: 90 tablet; Refill: 1 - Lipid panel  3. BMI 30.0-30.9,adult Discussed diet and exercise for person with BMI >25 Will recheck weight in 3-6 months     The above assessment and management plan was discussed with the patient. The patient verbalized understanding of and has agreed to the management plan. Patient is aware to call the clinic if symptoms persist or worsen. Patient is aware when to return to the clinic for a follow-up visit. Patient educated on when it is appropriate to go to the emergency department.   Mary-Margaret Hassell Done, FNP

## 2022-10-09 ENCOUNTER — Ambulatory Visit (HOSPITAL_COMMUNITY)
Admission: RE | Admit: 2022-10-09 | Discharge: 2022-10-09 | Disposition: A | Discharge status: Home / Self Care | Payer: Managed Care, Other (non HMO) | Source: Ambulatory Visit | Attending: Nurse Practitioner | Admitting: Nurse Practitioner

## 2022-10-09 DIAGNOSIS — Z1231 Encounter for screening mammogram for malignant neoplasm of breast: Secondary | ICD-10-CM | POA: Diagnosis present

## 2022-11-05 ENCOUNTER — Encounter: Payer: Self-pay | Admitting: *Deleted

## 2023-02-23 ENCOUNTER — Encounter: Payer: Self-pay | Admitting: *Deleted

## 2023-03-30 ENCOUNTER — Ambulatory Visit: Payer: Managed Care, Other (non HMO) | Admitting: Nurse Practitioner

## 2023-04-09 ENCOUNTER — Ambulatory Visit: Payer: Managed Care, Other (non HMO) | Admitting: Nurse Practitioner

## 2023-04-20 ENCOUNTER — Ambulatory Visit: Payer: Managed Care, Other (non HMO) | Admitting: Nurse Practitioner

## 2023-04-20 ENCOUNTER — Encounter: Payer: Self-pay | Admitting: Nurse Practitioner

## 2023-04-20 VITALS — BP 130/85 | HR 80 | Temp 98.2°F | Resp 20 | Ht 60.0 in | Wt 194.0 lb

## 2023-04-20 DIAGNOSIS — I10 Essential (primary) hypertension: Secondary | ICD-10-CM | POA: Diagnosis not present

## 2023-04-20 DIAGNOSIS — R739 Hyperglycemia, unspecified: Secondary | ICD-10-CM

## 2023-04-20 DIAGNOSIS — E785 Hyperlipidemia, unspecified: Secondary | ICD-10-CM | POA: Diagnosis not present

## 2023-04-20 DIAGNOSIS — Z6830 Body mass index (BMI) 30.0-30.9, adult: Secondary | ICD-10-CM

## 2023-04-20 MED ORDER — ROSUVASTATIN CALCIUM 20 MG PO TABS: 20.0000 mg | ORAL_TABLET | Freq: Every day | ORAL | 1 refills | Status: DC

## 2023-04-20 MED ORDER — LISINOPRIL 20 MG PO TABS: ORAL_TABLET | ORAL | 1 refills | Status: DC

## 2023-04-20 NOTE — Progress Notes (Signed)
Subjective:    Patient ID: Grace Rodriguez, female    DOB: 01-25-1971, 52 y.o.   MRN: [id number]   Chief Complaint: medical management of chronic issues     HPI:  Grace Rodriguez is a 52 y.o. who identifies as a female who was assigned female at birth.   Social history: Lives with: husband Work history: stays at [employer]. 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   Comes in today for follow up of the following chronic medical issues:  1. Primary hypertension No c/o chest pain, sob or headache. Does not check blood pressure at home. BP Readings from Last 3 Encounters:  09/30/22 125/81  03/31/22 136/83  10/01/21 122/74     2. Hyperlipidemia with target LDL less than 100 Does try  to watch diet but no dedicated exercise. Lab Results  Component Value Date   CHOL 133 09/30/2022   HDL 47 09/30/2022   LDLCALC 70 09/30/2022   TRIG 83 09/30/2022   CHOLHDL 2.8 09/30/2022   The 10-year ASCVD risk score (Arnett DK, et al., 2019) is: 1.9%   3. BMI 30.0-30.9,adult Weight is up 6lbs Wt Readings from Last 3 Encounters:  04/20/23 194 lb (88 kg)  09/30/22 188 lb (85.3 kg)  03/31/22 190 lb (86.2 kg)   BMI Readings from Last 3 Encounters:  04/20/23 37.89 kg/m  09/30/22 36.72 kg/m  03/31/22 37.11 kg/m     New complaints: None today  Allergies  Allergen Reactions   Sulfa Antibiotics Rash   Outpatient Encounter Medications as of 04/20/2023  Medication Sig   ibuprofen (ADVIL) 800 MG tablet Take 1 tablet (800 mg total) by mouth 3 (three) times daily. Take with food   lisinopril (ZESTRIL) 20 MG tablet TAKE ONE (1) TABLET EACH DAY   rosuvastatin (CRESTOR) 20 MG tablet Take 1 tablet (20 mg total) by mouth daily.   vitamin C (ASCORBIC ACID) 500 MG tablet Take 500 mg by mouth daily.   No facility-administered encounter medications on file as of 04/20/2023.    Past Surgical History:  Procedure Laterality Date   CESAREAN SECTION      Family History  Problem Relation Age of Onset   Diabetes  Mother    Hyperlipidemia Mother    Hypertension Mother    Cancer Maternal Aunt        Breast 2017      Controlled substance contract: n/a     Review of Systems  Constitutional:  Negative for diaphoresis.  Eyes:  Negative for pain.  Respiratory:  Negative for shortness of breath.   Cardiovascular:  Negative for chest pain, palpitations and leg swelling.  Gastrointestinal:  Negative for abdominal pain.  Endocrine: Negative for polydipsia.  Skin:  Negative for rash.  Neurological:  Negative for dizziness, weakness and headaches.  Hematological:  Does not bruise/bleed easily.  All other systems reviewed and are negative.      Objective:   Physical Exam Vitals and nursing note reviewed.  Constitutional:      General: She is not in acute distress.    Appearance: Normal appearance. She is well-developed.  HENT:     Head: Normocephalic.     Right Ear: Tympanic membrane normal.     Left Ear: Tympanic membrane normal.     Nose: Nose normal.     Mouth/Throat:     Mouth: Mucous membranes are moist.  Eyes:     Pupils: Pupils are equal, round, and reactive to light.  Neck:     Vascular:  No carotid bruit or JVD.  Cardiovascular:     Rate and Rhythm: Normal rate and regular rhythm.     Heart sounds: Normal heart sounds.  Pulmonary:     Effort: Pulmonary effort is normal. No respiratory distress.     Breath sounds: Normal breath sounds. No wheezing or rales.  Chest:     Chest wall: No tenderness.  Abdominal:     General: Bowel sounds are normal. There is no distension or abdominal bruit.     Palpations: Abdomen is soft. There is no hepatomegaly, splenomegaly, mass or pulsatile mass.     Tenderness: There is no abdominal tenderness.  Musculoskeletal:        General: Normal range of motion.     Cervical back: Normal range of motion and neck supple.  Lymphadenopathy:     Cervical: No cervical adenopathy.  Skin:    General: Skin is warm and dry.  Neurological:     Mental  Status: She is alert and oriented to person, place, and time.     Deep Tendon Reflexes: Reflexes are normal and symmetric.  Psychiatric:        Behavior: Behavior normal.        Thought Content: Thought content normal.        Judgment: Judgment normal.     BP 130/85   Pulse 80   Temp 98.2 F (36.8 C) (Temporal)   Resp 20   Ht 5' (1.524 m)   Wt 194 lb (88 kg)   SpO2 94%   BMI 37.89 kg/m        Assessment & Plan:   Grace Rodriguez comes in today with chief complaint of Medical Management of Chronic Issues   Diagnosis and orders addressed:  1. Primary hypertension Low sodium diet - lisinopril (ZESTRIL) 20 MG tablet; TAKE ONE (1) TABLET EACH DAY  Dispense: 90 tablet; Refill: 1 - CBC with Differential/Platelet - CMP14+EGFR  2. Hyperlipidemia with target LDL less than 100 Low fat diet - rosuvastatin (CRESTOR) 20 MG tablet; Take 1 tablet (20 mg total) by mouth daily.  Dispense: 90 tablet; Refill: 1 - Lipid panel  3. BMI 30.0-30.9,adult Discussed diet and exercise for person with BMI >25 Will recheck weight in 3-6 months    Labs pending Health Maintenance reviewed Diet and exercise encouraged  Follow up plan: 6 months   Mary-Margaret Daphine Deutscher, FNP

## 2023-04-21 LAB — CBC WITH DIFFERENTIAL/PLATELET
Basophils Absolute: 0.1 10*3/uL (ref 0.0–0.2)
Basos: 1 %
EOS (ABSOLUTE): 0.2 10*3/uL (ref 0.0–0.4)
Eos: 2 %
Hematocrit: 40.6 % (ref 34.0–46.6)
Hemoglobin: 13.4 g/dL (ref 11.1–15.9)
Immature Grans (Abs): 0.1 10*3/uL (ref 0.0–0.1)
Immature Granulocytes: 1 %
Lymphocytes Absolute: 2.9 10*3/uL (ref 0.7–3.1)
Lymphs: 30 %
MCH: 30.5 pg (ref 26.6–33.0)
MCHC: 33 g/dL (ref 31.5–35.7)
MCV: 92 fL (ref 79–97)
Monocytes Absolute: 0.5 10*3/uL (ref 0.1–0.9)
Monocytes: 5 %
Neutrophils Absolute: 5.9 10*3/uL (ref 1.4–7.0)
Neutrophils: 61 %
Platelets: 369 10*3/uL (ref 150–450)
RBC: 4.4 x10E6/uL (ref 3.77–5.28)
RDW: 13.5 % (ref 11.7–15.4)
WBC: 9.7 10*3/uL (ref 3.4–10.8)

## 2023-04-21 LAB — CMP14+EGFR
ALT: 48 IU/L — ABNORMAL HIGH (ref 0–32)
AST: 67 IU/L — ABNORMAL HIGH (ref 0–40)
Albumin: 4.2 g/dL (ref 3.8–4.9)
Alkaline Phosphatase: 121 IU/L (ref 44–121)
BUN/Creatinine Ratio: 13 (ref 9–23)
BUN: 9 mg/dL (ref 6–24)
Bilirubin Total: 0.4 mg/dL (ref 0.0–1.2)
CO2: 23 mmol/L (ref 20–29)
Calcium: 9.3 mg/dL (ref 8.7–10.2)
Chloride: 103 mmol/L (ref 96–106)
Creatinine, Ser: 0.72 mg/dL (ref 0.57–1.00)
Globulin, Total: 3 g/dL (ref 1.5–4.5)
Glucose: 125 mg/dL — ABNORMAL HIGH (ref 70–99)
Potassium: 4.7 mmol/L (ref 3.5–5.2)
Sodium: 141 mmol/L (ref 134–144)
Total Protein: 7.2 g/dL (ref 6.0–8.5)
eGFR: 99 mL/min/{1.73_m2} (ref >59)

## 2023-04-21 LAB — LIPID PANEL
Chol/HDL Ratio: 3.1 ratio (ref 0.0–4.4)
Cholesterol, Total: 138 mg/dL (ref 100–199)
HDL: 45 mg/dL (ref >39)
LDL Chol Calc (NIH): 74 mg/dL (ref 0–99)
Triglycerides: 104 mg/dL (ref 0–149)
VLDL Cholesterol Cal: 19 mg/dL (ref 5–40)

## 2023-04-23 NOTE — Addendum Note (Signed)
Addended by: Bennie Pierini on: 04/23/2023 07:57 AM   Modules accepted: Orders

## 2023-05-22 ENCOUNTER — Other Ambulatory Visit: Payer: Self-pay

## 2023-05-22 ENCOUNTER — Telehealth: Payer: Self-pay

## 2023-05-22 DIAGNOSIS — R739 Hyperglycemia, unspecified: Secondary | ICD-10-CM

## 2023-05-22 NOTE — Telephone Encounter (Signed)
Received notification from lab that Hgb A1c cannot be added on to blood that was previously drawn. Contacted patient and left detailed message on voicemail that lab could not be added and for her to stop by the office and have the labs drawn. Future orders placed

## 2023-10-05 ENCOUNTER — Other Ambulatory Visit (HOSPITAL_COMMUNITY): Payer: Self-pay | Admitting: Nurse Practitioner

## 2023-10-05 DIAGNOSIS — Z1231 Encounter for screening mammogram for malignant neoplasm of breast: Secondary | ICD-10-CM

## 2023-10-12 ENCOUNTER — Ambulatory Visit (HOSPITAL_COMMUNITY)
Admission: RE | Admit: 2023-10-12 | Discharge: 2023-10-12 | Disposition: A | Discharge status: Home / Self Care | Payer: Managed Care, Other (non HMO) | Source: Ambulatory Visit | Attending: Nurse Practitioner | Admitting: Nurse Practitioner

## 2023-10-12 DIAGNOSIS — Z1231 Encounter for screening mammogram for malignant neoplasm of breast: Secondary | ICD-10-CM | POA: Insufficient documentation

## 2023-10-20 ENCOUNTER — Encounter: Payer: Self-pay | Admitting: Nurse Practitioner

## 2023-10-20 ENCOUNTER — Ambulatory Visit: Payer: Managed Care, Other (non HMO) | Admitting: Nurse Practitioner

## 2023-10-20 VITALS — BP 120/84 | HR 105 | Temp 98.3°F | Ht 60.0 in | Wt 189.2 lb

## 2023-10-20 DIAGNOSIS — I10 Essential (primary) hypertension: Secondary | ICD-10-CM

## 2023-10-20 DIAGNOSIS — Z1211 Encounter for screening for malignant neoplasm of colon: Secondary | ICD-10-CM

## 2023-10-20 DIAGNOSIS — Z6830 Body mass index (BMI) 30.0-30.9, adult: Secondary | ICD-10-CM | POA: Diagnosis not present

## 2023-10-20 DIAGNOSIS — E119 Type 2 diabetes mellitus without complications: Secondary | ICD-10-CM

## 2023-10-20 DIAGNOSIS — R739 Hyperglycemia, unspecified: Secondary | ICD-10-CM

## 2023-10-20 DIAGNOSIS — E785 Hyperlipidemia, unspecified: Secondary | ICD-10-CM

## 2023-10-20 LAB — LIPID PANEL

## 2023-10-20 LAB — BAYER DCA HB A1C WAIVED: HB A1C (BAYER DCA - WAIVED): 6.4 % — ABNORMAL HIGH (ref 4.8–5.6)

## 2023-10-20 MED ORDER — LISINOPRIL 20 MG PO TABS: ORAL_TABLET | ORAL | 1 refills | Status: DC

## 2023-10-20 MED ORDER — ROSUVASTATIN CALCIUM 20 MG PO TABS
20.0000 mg | ORAL_TABLET | Freq: Every day | ORAL | 1 refills | Status: DC
Start: 2023-10-20 — End: 2024-04-15

## 2023-10-20 NOTE — Patient Instructions (Signed)

## 2023-10-20 NOTE — Progress Notes (Addendum)
 Subjective:    Patient ID: Grace Rodriguez, female    DOB: 1971/08/21, 53 y.o.   MRN: [id number]   Chief Complaint: medical management of chronic issues     HPI:  Grace Rodriguez is a 53 y.o. who identifies as a female who was assigned female at birth.   Social history: Lives with: husband Work history: stays at [employer]. 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   Comes in today for follow up of the following chronic medical issues:  1. Primary hypertension No c/o chest pain, sob or headache. Does not check blood pressure at home. BP Readings from Last 3 Encounters:  04/20/23 130/85  09/30/22 125/81  03/31/22 136/83     2. Hyperlipidemia with target LDL less than 100 Does try  to watch diet but no dedicated exercise. Lab Results  Component Value Date   CHOL 138 04/20/2023   HDL 45 04/20/2023   LDLCALC 74 04/20/2023   TRIG 104 04/20/2023   CHOLHDL 3.1 04/20/2023   The 10-year ASCVD risk score (Arnett DK, et al., 2019) is: 2%   3. BMI 30.0-30.9,adult Weight is up 6lbs Wt Readings from Last 3 Encounters:  10/20/23 189 lb 3.2 oz (85.8 kg)  04/20/23 194 lb (88 kg)  09/30/22 188 lb (85.3 kg)   BMI Readings from Last 3 Encounters:  10/20/23 36.95 kg/m  04/20/23 37.89 kg/m  09/30/22 36.72 kg/m       New complaints: The last several blood sugars have been elevated HGBA1c was added but was never done. Will check today. Red rash on right flank that is itchy. Has been there about 1 week. Denies pain  Allergies  Allergen Reactions   Sulfa Antibiotics Rash   Outpatient Encounter Medications as of 10/20/2023  Medication Sig   ibuprofen (ADVIL) 800 MG tablet Take 1 tablet (800 mg total) by mouth 3 (three) times daily. Take with food   lisinopril (ZESTRIL) 20 MG tablet TAKE ONE (1) TABLET EACH DAY   Multiple Vitamin (MULTIVITAMIN PO) Take by mouth.   rosuvastatin (CRESTOR) 20 MG tablet Take 1 tablet (20 mg total) by mouth daily.   vitamin C (ASCORBIC ACID) 500 MG tablet Take 500  mg by mouth daily.   No facility-administered encounter medications on file as of 10/20/2023.    Past Surgical History:  Procedure Laterality Date   CESAREAN SECTION      Family History  Problem Relation Age of Onset   Diabetes Mother    Hyperlipidemia Mother    Hypertension Mother    Cancer Maternal Aunt        Breast 2017      Controlled substance contract: n/a     Review of Systems  Constitutional:  Negative for diaphoresis.  Eyes:  Negative for pain.  Respiratory:  Negative for shortness of breath.   Cardiovascular:  Negative for chest pain, palpitations and leg swelling.  Gastrointestinal:  Negative for abdominal pain.  Endocrine: Negative for polydipsia.  Skin:  Negative for rash.  Neurological:  Negative for dizziness, weakness and headaches.  Hematological:  Does not bruise/bleed easily.  All other systems reviewed and are negative.      Objective:   Physical Exam Vitals and nursing note reviewed.  Constitutional:      General: She is not in acute distress.    Appearance: Normal appearance. She is well-developed.  HENT:     Head: Normocephalic.     Right Ear: Tympanic membrane normal.     Left Ear: Tympanic  membrane normal.     Nose: Nose normal.     Mouth/Throat:     Mouth: Mucous membranes are moist.  Eyes:     Pupils: Pupils are equal, round, and reactive to light.  Neck:     Vascular: No carotid bruit or JVD.  Cardiovascular:     Rate and Rhythm: Normal rate and regular rhythm.     Heart sounds: Normal heart sounds.  Pulmonary:     Effort: Pulmonary effort is normal. No respiratory distress.     Breath sounds: Normal breath sounds. No wheezing or rales.  Chest:     Chest wall: No tenderness.  Abdominal:     General: Bowel sounds are normal. There is no distension or abdominal bruit.     Palpations: Abdomen is soft. There is no hepatomegaly, splenomegaly, mass or pulsatile mass.     Tenderness: There is no abdominal tenderness.   Musculoskeletal:        General: Normal range of motion.     Cervical back: Normal range of motion and neck supple.  Lymphadenopathy:     Cervical: No cervical adenopathy.  Skin:    General: Skin is warm and dry.     Comments: Vesicular lesions in linear pattern on left flank-  Neurological:     Mental Status: She is alert and oriented to person, place, and time.     Deep Tendon Reflexes: Reflexes are normal and symmetric.  Psychiatric:        Behavior: Behavior normal.        Thought Content: Thought content normal.        Judgment: Judgment normal.     BP 120/84   Pulse (!) 105   Temp 98.3 F (36.8 C)   Ht 5' (1.524 m)   Wt 189 lb 3.2 oz (85.8 kg)   SpO2 98%   BMI 36.95 kg/m    HGBA1c 6.4%     Assessment & Plan:   Grace Rodriguez comes in today with chief complaint of No chief complaint on file.   Diagnosis and orders addressed:  1. Primary hypertension Low sodium diet - lisinopril (ZESTRIL) 20 MG tablet; TAKE ONE (1) TABLET EACH DAY  Dispense: 90 tablet; Refill: 1 - CBC with Differential/Platelet - CMP14+EGFR  2. Hyperlipidemia with target LDL less than 100 Low fat diet - rosuvastatin (CRESTOR) 20 MG tablet; Take 1 tablet (20 mg total) by mouth daily.  Dispense: 90 tablet; Refill: 1 - Lipid panel  3. BMI 30.0-30.9,adult Discussed diet and exercise for person with BMI >25 Will recheck weight in 3-6 months  4. Diet controlled diabetes Low carb diet  5. Shingles Had to  long to treat Report if starts hurting Avoid rubbing or scratching  Labs pending Health Maintenance reviewed Diet and exercise encouraged  Follow up plan: 6 months   Mary-Margaret Daphine Deutscher, FNP

## 2023-10-21 LAB — CMP14+EGFR
ALT: 53 IU/L — ABNORMAL HIGH (ref 0–32)
AST: 71 IU/L — ABNORMAL HIGH (ref 0–40)
Albumin: 4.5 g/dL (ref 3.8–4.9)
Alkaline Phosphatase: 127 IU/L — ABNORMAL HIGH (ref 44–121)
BUN/Creatinine Ratio: 15 (ref 9–23)
BUN: 12 mg/dL (ref 6–24)
Bilirubin Total: 0.5 mg/dL (ref 0.0–1.2)
CO2: 22 mmol/L (ref 20–29)
Calcium: 9.5 mg/dL (ref 8.7–10.2)
Chloride: 102 mmol/L (ref 96–106)
Creatinine, Ser: 0.82 mg/dL (ref 0.57–1.00)
Globulin, Total: 3.2 g/dL (ref 1.5–4.5)
Glucose: 114 mg/dL — ABNORMAL HIGH (ref 70–99)
Potassium: 4.1 mmol/L (ref 3.5–5.2)
Sodium: 143 mmol/L (ref 134–144)
Total Protein: 7.7 g/dL (ref 6.0–8.5)
eGFR: 85 mL/min/{1.73_m2} (ref >59)

## 2023-10-21 LAB — CBC WITH DIFFERENTIAL/PLATELET
Basophils Absolute: 0.1 10*3/uL (ref 0.0–0.2)
Basos: 1 %
EOS (ABSOLUTE): 0.2 10*3/uL (ref 0.0–0.4)
Eos: 2 %
Hematocrit: 43.4 % (ref 34.0–46.6)
Hemoglobin: 14.1 g/dL (ref 11.1–15.9)
Immature Grans (Abs): 0 10*3/uL (ref 0.0–0.1)
Immature Granulocytes: 0 %
Lymphocytes Absolute: 3.1 10*3/uL (ref 0.7–3.1)
Lymphs: 33 %
MCH: 30.9 pg (ref 26.6–33.0)
MCHC: 32.5 g/dL (ref 31.5–35.7)
MCV: 95 fL (ref 79–97)
Monocytes Absolute: 0.5 10*3/uL (ref 0.1–0.9)
Monocytes: 5 %
Neutrophils Absolute: 5.4 10*3/uL (ref 1.4–7.0)
Neutrophils: 59 %
Platelets: 377 10*3/uL (ref 150–450)
RBC: 4.57 x10E6/uL (ref 3.77–5.28)
RDW: 13.4 % (ref 11.7–15.4)
WBC: 9.3 10*3/uL (ref 3.4–10.8)

## 2023-10-21 LAB — LIPID PANEL
Cholesterol, Total: 130 mg/dL (ref 100–199)
HDL: 43 mg/dL (ref >39)
LDL CALC COMMENT:: 3 ratio (ref 0.0–4.4)
LDL Chol Calc (NIH): 67 mg/dL (ref 0–99)
Triglycerides: 109 mg/dL (ref 0–149)
VLDL Cholesterol Cal: 20 mg/dL (ref 5–40)

## 2023-10-30 LAB — COLOGUARD: COLOGUARD: NEGATIVE

## 2024-04-14 NOTE — Progress Notes (Signed)
 Subjective:    Patient ID: Grace Rodriguez, female    DOB: 1971-02-23, 53 y.o.   MRN: [id number]   Chief Complaint: annual physical    HPI:  Grace Rodriguez is a 53 y.o. who identifies as a female who was assigned female at birth.   Social history: Lives with: husband Work history: stays at [employer]. 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   Comes in today for follow up of the following chronic medical issues:  1. Primary hypertension No c/o chest pain, sob or headache. Does not check blood pressure at home. BP Readings from Last 3 Encounters:  10/20/23 120/84  04/20/23 130/85  09/30/22 125/81     2. Hyperlipidemia with target LDL less than 100 Does try  to watch diet but no dedicated exercise. Lab Results  Component Value Date   CHOL 130 10/20/2023   HDL 43 10/20/2023   LDLCALC 67 10/20/2023   TRIG 109 10/20/2023   CHOLHDL 3.0 10/20/2023   The 10-year ASCVD risk score (Arnett DK, et al., 2019) is: 4.9%   3. Diet controlled diabetes Does not check blood sugars at home. Lab Results  Component Value Date   HGBA1C 6.4 (H) 10/20/2023    4. BMI 36.0-36.9,adult Weight is up 2lbs  Wt Readings from Last 3 Encounters:  04/15/24 191 lb (86.6 kg)  10/20/23 189 lb 3.2 oz (85.8 kg)  04/20/23 194 lb (88 kg)   BMI Readings from Last 3 Encounters:  04/15/24 37.30 kg/m  10/20/23 36.95 kg/m  04/20/23 37.89 kg/m        New complaints: None today  Allergies  Allergen Reactions   Sulfa Antibiotics Rash   Outpatient Encounter Medications as of 04/15/2024  Medication Sig   ibuprofen  (ADVIL ) 800 MG tablet Take 1 tablet (800 mg total) by mouth 3 (three) times daily. Take with food (Patient not taking: Reported on 10/20/2023)   lisinopril  (ZESTRIL ) 20 MG tablet TAKE ONE (1) TABLET EACH DAY   Multiple Vitamin (MULTIVITAMIN PO) Take by mouth.   rosuvastatin  (CRESTOR ) 20 MG tablet Take 1 tablet (20 mg total) by mouth daily.   vitamin C (ASCORBIC ACID) 500 MG tablet Take 500 mg by  mouth daily.   No facility-administered encounter medications on file as of 04/15/2024.    Past Surgical History:  Procedure Laterality Date   CESAREAN SECTION      Family History  Problem Relation Age of Onset   Diabetes Mother    Hyperlipidemia Mother    Hypertension Mother    Cancer Maternal Aunt        Breast 2017      Controlled substance contract: n/a     Review of Systems  Constitutional:  Negative for diaphoresis.  Eyes:  Negative for pain.  Respiratory:  Negative for shortness of breath.   Cardiovascular:  Negative for chest pain, palpitations and leg swelling.  Gastrointestinal:  Negative for abdominal pain.  Endocrine: Negative for polydipsia.  Skin:  Negative for rash.  Neurological:  Negative for dizziness, weakness and headaches.  Hematological:  Does not bruise/bleed easily.  All other systems reviewed and are negative.      Objective:   Physical Exam Vitals and nursing note reviewed.  Constitutional:      General: She is not in acute distress.    Appearance: Normal appearance. She is well-developed.  HENT:     Head: Normocephalic.     Right Ear: Tympanic membrane normal.     Left Ear: Tympanic membrane normal.  Nose: Nose normal.     Mouth/Throat:     Mouth: Mucous membranes are moist.  Eyes:     Pupils: Pupils are equal, round, and reactive to light.  Neck:     Vascular: No carotid bruit or JVD.  Cardiovascular:     Rate and Rhythm: Normal rate and regular rhythm.     Heart sounds: Normal heart sounds.  Pulmonary:     Effort: Pulmonary effort is normal. No respiratory distress.     Breath sounds: Normal breath sounds. No wheezing or rales.  Chest:     Chest wall: No tenderness.  Abdominal:     General: Bowel sounds are normal. There is no distension or abdominal bruit.     Palpations: Abdomen is soft. There is no hepatomegaly, splenomegaly, mass or pulsatile mass.     Tenderness: There is no abdominal tenderness.   Musculoskeletal:        General: Normal range of motion.     Cervical back: Normal range of motion and neck supple.  Lymphadenopathy:     Cervical: No cervical adenopathy.  Skin:    General: Skin is warm and dry.     Comments: Vesicular lesions in linear pattern on left flank-  Neurological:     Mental Status: She is alert and oriented to person, place, and time.     Deep Tendon Reflexes: Reflexes are normal and symmetric.  Psychiatric:        Behavior: Behavior normal.        Thought Content: Thought content normal.        Judgment: Judgment normal.     BP 139/83   Pulse 74   Temp 97.9 F (36.6 C) (Temporal)   Ht 5' (1.524 m)   Wt 191 lb (86.6 kg)   SpO2 96%   BMI 37.30 kg/m     HGBA1c 6.3%%     Assessment & Plan:   Grace Rodriguez comes in today with chief complaint of annual physical  Diagnosis and orders addressed:  1. Hypertension assoc with diabetes Low sodium diet - lisinopril  (ZESTRIL ) 20 MG tablet; TAKE ONE (1) TABLET EACH DAY  Dispense: 90 tablet; Refill: 1 - CBC with Differential/Platelet - CMP14+EGFR  2. Hyperlipidemia with target LDL less than 100 Low fat diet - rosuvastatin  (CRESTOR ) 20 MG tablet; Take 1 tablet (20 mg total) by mouth daily.  Dispense: 90 tablet; Refill: 1 - Lipid panel  3. BMI 37.0-37.9,adult Discussed diet and exercise for person with BMI >25 Will recheck weight in 3-6 months  4. Diet controlled diabetes Low carb diet  5. Shingles Had to  long to treat Report if starts hurting Avoid rubbing or scratching  Labs pending Health Maintenance reviewed Diet and exercise encouraged  Follow up plan: 6 months   Mary-Margaret Gladis, FNP

## 2024-04-15 ENCOUNTER — Encounter: Payer: Self-pay | Admitting: Nurse Practitioner

## 2024-04-15 ENCOUNTER — Ambulatory Visit: Payer: Managed Care, Other (non HMO) | Admitting: Nurse Practitioner

## 2024-04-15 VITALS — BP 139/83 | HR 74 | Temp 97.9°F | Ht 60.0 in | Wt 191.0 lb

## 2024-04-15 DIAGNOSIS — Z0001 Encounter for general adult medical examination with abnormal findings: Secondary | ICD-10-CM | POA: Diagnosis not present

## 2024-04-15 DIAGNOSIS — E1159 Type 2 diabetes mellitus with other circulatory complications: Secondary | ICD-10-CM | POA: Diagnosis not present

## 2024-04-15 DIAGNOSIS — E785 Hyperlipidemia, unspecified: Secondary | ICD-10-CM | POA: Diagnosis not present

## 2024-04-15 DIAGNOSIS — I10 Essential (primary) hypertension: Secondary | ICD-10-CM

## 2024-04-15 DIAGNOSIS — Z0000 Encounter for general adult medical examination without abnormal findings: Secondary | ICD-10-CM

## 2024-04-15 DIAGNOSIS — E119 Type 2 diabetes mellitus without complications: Secondary | ICD-10-CM

## 2024-04-15 DIAGNOSIS — Z6837 Body mass index (BMI) 37.0-37.9, adult: Secondary | ICD-10-CM | POA: Diagnosis not present

## 2024-04-15 DIAGNOSIS — I152 Hypertension secondary to endocrine disorders: Secondary | ICD-10-CM

## 2024-04-15 LAB — BAYER DCA HB A1C WAIVED: HB A1C (BAYER DCA - WAIVED): 6.3 % — ABNORMAL HIGH (ref 4.8–5.6)

## 2024-04-15 MED ORDER — ROSUVASTATIN CALCIUM 20 MG PO TABS: 20.0000 mg | ORAL_TABLET | Freq: Every day | ORAL | 1 refills | Status: AC

## 2024-04-15 MED ORDER — LISINOPRIL 20 MG PO TABS: ORAL_TABLET | ORAL | 1 refills | Status: AC

## 2024-04-15 NOTE — Patient Instructions (Signed)

## 2024-04-16 LAB — CMP14+EGFR
ALT: 43 IU/L — ABNORMAL HIGH (ref 0–32)
AST: 48 IU/L — ABNORMAL HIGH (ref 0–40)
Albumin: 4.5 g/dL (ref 3.8–4.9)
Alkaline Phosphatase: 121 IU/L (ref 44–121)
BUN/Creatinine Ratio: 17 (ref 9–23)
BUN: 14 mg/dL (ref 6–24)
Bilirubin Total: 0.3 mg/dL (ref 0.0–1.2)
CO2: 21 mmol/L (ref 20–29)
Calcium: 9.4 mg/dL (ref 8.7–10.2)
Chloride: 102 mmol/L (ref 96–106)
Creatinine, Ser: 0.81 mg/dL (ref 0.57–1.00)
Globulin, Total: 3 g/dL (ref 1.5–4.5)
Glucose: 120 mg/dL — ABNORMAL HIGH (ref 70–99)
Potassium: 4.1 mmol/L (ref 3.5–5.2)
Sodium: 142 mmol/L (ref 134–144)
Total Protein: 7.5 g/dL (ref 6.0–8.5)
eGFR: 86 mL/min/1.73 (ref >59)

## 2024-04-16 LAB — CBC WITH DIFFERENTIAL/PLATELET
Basophils Absolute: 0.1 x10E3/uL (ref 0.0–0.2)
Basos: 1 %
EOS (ABSOLUTE): 0.2 x10E3/uL (ref 0.0–0.4)
Eos: 2 %
Hematocrit: 42.5 % (ref 34.0–46.6)
Hemoglobin: 13.8 g/dL (ref 11.1–15.9)
Immature Grans (Abs): 0 x10E3/uL (ref 0.0–0.1)
Immature Granulocytes: 0 %
Lymphocytes Absolute: 3.1 x10E3/uL (ref 0.7–3.1)
Lymphs: 31 %
MCH: 31 pg (ref 26.6–33.0)
MCHC: 32.5 g/dL (ref 31.5–35.7)
MCV: 96 fL (ref 79–97)
Monocytes Absolute: 0.6 x10E3/uL (ref 0.1–0.9)
Monocytes: 6 %
Neutrophils Absolute: 6 x10E3/uL (ref 1.4–7.0)
Neutrophils: 60 %
Platelets: 361 x10E3/uL (ref 150–450)
RBC: 4.45 x10E6/uL (ref 3.77–5.28)
RDW: 13.4 % (ref 11.7–15.4)
WBC: 10 x10E3/uL (ref 3.4–10.8)

## 2024-04-16 LAB — LIPID PANEL
Chol/HDL Ratio: 2.8 ratio (ref 0.0–4.4)
Cholesterol, Total: 127 mg/dL (ref 100–199)
HDL: 46 mg/dL (ref >39)
LDL Chol Calc (NIH): 65 mg/dL (ref 0–99)
Triglycerides: 79 mg/dL (ref 0–149)
VLDL Cholesterol Cal: 16 mg/dL (ref 5–40)

## 2024-04-16 LAB — MICROALBUMIN / CREATININE URINE RATIO
Creatinine, Urine: 40.9 mg/dL
Microalb/Creat Ratio: <7 mg/g{creat} (ref 0–29)
Microalbumin, Urine: <3 ug/mL

## 2024-04-18 ENCOUNTER — Ambulatory Visit: Payer: Self-pay | Admitting: Nurse Practitioner

## 2024-10-05 ENCOUNTER — Other Ambulatory Visit (HOSPITAL_COMMUNITY): Payer: Self-pay | Admitting: Nurse Practitioner

## 2024-10-05 DIAGNOSIS — Z1231 Encounter for screening mammogram for malignant neoplasm of breast: Secondary | ICD-10-CM

## 2024-10-14 ENCOUNTER — Ambulatory Visit (HOSPITAL_COMMUNITY)

## 2024-10-18 ENCOUNTER — Ambulatory Visit: Payer: Self-pay | Admitting: Nurse Practitioner
